# Patient Record
Sex: Male | Born: 1985 | Race: Asian | Hispanic: No | Marital: Married | Smoking: Never smoker
Health system: Southern US, Community
[De-identification: ages and names within clinical notes are randomized; demographics above are authoritative.]

## PROBLEM LIST (undated history)

## (undated) DIAGNOSIS — L4050 Arthropathic psoriasis, unspecified: Secondary | ICD-10-CM

---

## 2018-03-01 ENCOUNTER — Observation Stay (HOSPITAL_COMMUNITY): Payer: No Typology Code available for payment source | Admitting: Certified Registered"

## 2018-03-01 ENCOUNTER — Observation Stay (HOSPITAL_COMMUNITY)
Admission: EM | Admit: 2018-03-01 | Discharge: 2018-03-02 | Disposition: A | Discharge status: Home / Self Care | Payer: No Typology Code available for payment source | Attending: General Surgery | Admitting: General Surgery

## 2018-03-01 ENCOUNTER — Encounter (HOSPITAL_COMMUNITY)
Admission: EM | Disposition: A | Discharge status: Home / Self Care | Payer: Self-pay | Source: Home / Self Care | Attending: Emergency Medicine

## 2018-03-01 ENCOUNTER — Encounter (HOSPITAL_COMMUNITY): Payer: Self-pay

## 2018-03-01 ENCOUNTER — Emergency Department (HOSPITAL_COMMUNITY): Payer: No Typology Code available for payment source

## 2018-03-01 ENCOUNTER — Other Ambulatory Visit: Payer: Self-pay

## 2018-03-01 DIAGNOSIS — K3580 Unspecified acute appendicitis: Secondary | ICD-10-CM | POA: Diagnosis present

## 2018-03-01 DIAGNOSIS — K3533 Acute appendicitis with perforation and localized peritonitis, with abscess: Secondary | ICD-10-CM | POA: Diagnosis not present

## 2018-03-01 DIAGNOSIS — R109 Unspecified abdominal pain: Secondary | ICD-10-CM | POA: Diagnosis present

## 2018-03-01 HISTORY — PX: LAPAROSCOPIC APPENDECTOMY: SHX408

## 2018-03-01 LAB — COMPREHENSIVE METABOLIC PANEL
ALT: 97 U/L — ABNORMAL HIGH (ref 17–63)
ANION GAP: 12 (ref 5–15)
AST: 38 U/L (ref 15–41)
Albumin: 4.5 g/dL (ref 3.5–5.0)
Alkaline Phosphatase: 71 U/L (ref 38–126)
BUN: 13 mg/dL (ref 6–20)
CHLORIDE: 98 mmol/L — AB (ref 101–111)
CO2: 27 mmol/L (ref 22–32)
CREATININE: 0.88 mg/dL (ref 0.61–1.24)
Calcium: 10.2 mg/dL (ref 8.9–10.3)
Glucose, Bld: 124 mg/dL — ABNORMAL HIGH (ref 65–99)
POTASSIUM: 4.1 mmol/L (ref 3.5–5.1)
Sodium: 137 mmol/L (ref 135–145)
Total Bilirubin: 1.1 mg/dL (ref 0.3–1.2)
Total Protein: 8.7 g/dL — ABNORMAL HIGH (ref 6.5–8.1)

## 2018-03-01 LAB — CBC
HEMATOCRIT: 50 % (ref 39.0–52.0)
HEMOGLOBIN: 16.7 g/dL (ref 13.0–17.0)
MCH: 28.9 pg (ref 26.0–34.0)
MCHC: 33.4 g/dL (ref 30.0–36.0)
MCV: 86.7 fL (ref 78.0–100.0)
PLATELETS: 279 10*3/uL (ref 150–400)
RBC: 5.77 MIL/uL (ref 4.22–5.81)
RDW: 13.9 % (ref 11.5–15.5)
WBC: 21.2 10*3/uL — AB (ref 4.0–10.5)

## 2018-03-01 LAB — LIPASE, BLOOD: LIPASE: 24 U/L (ref 11–51)

## 2018-03-01 LAB — URINALYSIS, ROUTINE W REFLEX MICROSCOPIC
Bilirubin Urine: NEGATIVE
GLUCOSE, UA: NEGATIVE mg/dL
Hgb urine dipstick: NEGATIVE
Ketones, ur: 20 mg/dL — AB
Leukocytes, UA: NEGATIVE
Nitrite: NEGATIVE
PROTEIN: NEGATIVE mg/dL
pH: 7 (ref 5.0–8.0)

## 2018-03-01 SURGERY — APPENDECTOMY, LAPAROSCOPIC
Anesthesia: General | Site: Abdomen

## 2018-03-01 MED ORDER — BUPIVACAINE-EPINEPHRINE (PF) 0.25% -1:200000 IJ SOLN
INTRAMUSCULAR | Status: AC
  Filled 2018-03-01: qty 30

## 2018-03-01 MED ORDER — SODIUM CHLORIDE 0.9 % IV SOLN: INTRAVENOUS | Status: DC

## 2018-03-01 MED ORDER — SODIUM CHLORIDE 0.9 % IR SOLN
Status: DC | PRN
  Administered 2018-03-01: 1000 mL

## 2018-03-01 MED ORDER — SODIUM CHLORIDE 0.9 % IV SOLN
INTRAVENOUS | Status: DC
  Administered 2018-03-01: 21:00:00 via INTRAVENOUS

## 2018-03-01 MED ORDER — MORPHINE SULFATE (PF) 4 MG/ML IV SOLN
4.0000 mg | Freq: Once | INTRAVENOUS | Status: AC
  Administered 2018-03-01: 4 mg via INTRAVENOUS
  Filled 2018-03-01: qty 1

## 2018-03-01 MED ORDER — DIPHENHYDRAMINE HCL 50 MG/ML IJ SOLN: 25.0000 mg | Freq: Four times a day (QID) | INTRAMUSCULAR | Status: DC | PRN

## 2018-03-01 MED ORDER — ROCURONIUM BROMIDE 10 MG/ML (PF) SYRINGE
PREFILLED_SYRINGE | INTRAVENOUS | Status: AC
  Filled 2018-03-01: qty 5

## 2018-03-01 MED ORDER — DEXAMETHASONE SODIUM PHOSPHATE 10 MG/ML IJ SOLN
INTRAMUSCULAR | Status: DC | PRN
  Administered 2018-03-01: 10 mg via INTRAVENOUS

## 2018-03-01 MED ORDER — IOPAMIDOL (ISOVUE-300) INJECTION 61%
INTRAVENOUS | Status: AC
  Administered 2018-03-01: 100 mL
  Filled 2018-03-01: qty 100

## 2018-03-01 MED ORDER — PIPERACILLIN-TAZOBACTAM 3.375 G IVPB: 3.3750 g | Freq: Three times a day (TID) | INTRAVENOUS | Status: DC

## 2018-03-01 MED ORDER — SUGAMMADEX SODIUM 200 MG/2ML IV SOLN
INTRAVENOUS | Status: DC | PRN
  Administered 2018-03-01: 200 mg via INTRAVENOUS

## 2018-03-01 MED ORDER — ONDANSETRON 4 MG PO TBDP: 4.0000 mg | ORAL_TABLET | Freq: Four times a day (QID) | ORAL | Status: DC | PRN

## 2018-03-01 MED ORDER — MIDAZOLAM HCL 2 MG/2ML IJ SOLN
INTRAMUSCULAR | Status: AC
  Filled 2018-03-01: qty 2

## 2018-03-01 MED ORDER — GABAPENTIN 300 MG PO CAPS
300.0000 mg | ORAL_CAPSULE | Freq: Two times a day (BID) | ORAL | Status: DC
  Administered 2018-03-01 – 2018-03-02 (×2): 300 mg via ORAL
  Filled 2018-03-01 (×2): qty 1

## 2018-03-01 MED ORDER — ONDANSETRON HCL 4 MG/2ML IJ SOLN
INTRAMUSCULAR | Status: DC | PRN
  Administered 2018-03-01: 4 mg via INTRAVENOUS

## 2018-03-01 MED ORDER — FENTANYL CITRATE (PF) 250 MCG/5ML IJ SOLN
INTRAMUSCULAR | Status: AC
  Filled 2018-03-01: qty 5

## 2018-03-01 MED ORDER — METRONIDAZOLE IN NACL 5-0.79 MG/ML-% IV SOLN
500.0000 mg | Freq: Once | INTRAVENOUS | Status: AC
  Administered 2018-03-01: 500 mg via INTRAVENOUS
  Filled 2018-03-01: qty 100

## 2018-03-01 MED ORDER — BUPIVACAINE-EPINEPHRINE 0.25% -1:200000 IJ SOLN
INTRAMUSCULAR | Status: DC | PRN
  Administered 2018-03-01: 30 mL

## 2018-03-01 MED ORDER — SUCCINYLCHOLINE CHLORIDE 200 MG/10ML IV SOSY
PREFILLED_SYRINGE | INTRAVENOUS | Status: AC
  Filled 2018-03-01: qty 10

## 2018-03-01 MED ORDER — HYDROMORPHONE HCL 1 MG/ML IJ SOLN
0.5000 mg | Freq: Once | INTRAMUSCULAR | Status: AC
  Administered 2018-03-01: 0.5 mg via INTRAVENOUS
  Filled 2018-03-01: qty 1

## 2018-03-01 MED ORDER — MIDAZOLAM HCL 5 MG/5ML IJ SOLN
INTRAMUSCULAR | Status: DC | PRN
  Administered 2018-03-01: 2 mg via INTRAVENOUS

## 2018-03-01 MED ORDER — LACTATED RINGERS IV SOLN
INTRAVENOUS | Status: DC
  Administered 2018-03-01 (×2): via INTRAVENOUS

## 2018-03-01 MED ORDER — PROPOFOL 10 MG/ML IV BOLUS
INTRAVENOUS | Status: DC | PRN
  Administered 2018-03-01: 200 mg via INTRAVENOUS

## 2018-03-01 MED ORDER — KETOROLAC TROMETHAMINE 30 MG/ML IJ SOLN
30.0000 mg | Freq: Four times a day (QID) | INTRAMUSCULAR | Status: DC
  Administered 2018-03-01 – 2018-03-02 (×2): 30 mg via INTRAVENOUS
  Filled 2018-03-01 (×2): qty 1

## 2018-03-01 MED ORDER — DIPHENHYDRAMINE HCL 25 MG PO CAPS: 25.0000 mg | ORAL_CAPSULE | Freq: Four times a day (QID) | ORAL | Status: DC | PRN

## 2018-03-01 MED ORDER — PROPOFOL 10 MG/ML IV BOLUS
INTRAVENOUS | Status: AC
  Filled 2018-03-01: qty 20

## 2018-03-01 MED ORDER — HYDRALAZINE HCL 20 MG/ML IJ SOLN: 10.0000 mg | INTRAMUSCULAR | Status: DC | PRN

## 2018-03-01 MED ORDER — SCOPOLAMINE 1 MG/3DAYS TD PT72
1.0000 | MEDICATED_PATCH | TRANSDERMAL | Status: DC
  Administered 2018-03-01: 1 via TRANSDERMAL
  Filled 2018-03-01: qty 1

## 2018-03-01 MED ORDER — ROCURONIUM BROMIDE 100 MG/10ML IV SOLN
INTRAVENOUS | Status: DC | PRN
  Administered 2018-03-01: 50 mg via INTRAVENOUS

## 2018-03-01 MED ORDER — ONDANSETRON HCL 4 MG/2ML IJ SOLN: 4.0000 mg | Freq: Four times a day (QID) | INTRAMUSCULAR | Status: DC | PRN

## 2018-03-01 MED ORDER — HYDROCODONE-ACETAMINOPHEN 5-325 MG PO TABS: 1.0000 | ORAL_TABLET | ORAL | Status: DC | PRN

## 2018-03-01 MED ORDER — PROMETHAZINE HCL 25 MG/ML IJ SOLN: 6.2500 mg | INTRAMUSCULAR | Status: DC | PRN

## 2018-03-01 MED ORDER — 0.9 % SODIUM CHLORIDE (POUR BTL) OPTIME
TOPICAL | Status: DC | PRN
  Administered 2018-03-01: 1000 mL

## 2018-03-01 MED ORDER — TRAMADOL HCL 50 MG PO TABS: 50.0000 mg | ORAL_TABLET | Freq: Four times a day (QID) | ORAL | Status: DC | PRN

## 2018-03-01 MED ORDER — LIDOCAINE HCL (CARDIAC) 20 MG/ML IV SOLN
INTRAVENOUS | Status: AC
  Filled 2018-03-01: qty 5

## 2018-03-01 MED ORDER — MORPHINE SULFATE (PF) 4 MG/ML IV SOLN: 2.0000 mg | INTRAVENOUS | Status: DC | PRN

## 2018-03-01 MED ORDER — FENTANYL CITRATE (PF) 100 MCG/2ML IJ SOLN
INTRAMUSCULAR | Status: DC | PRN
  Administered 2018-03-01: 50 ug via INTRAVENOUS
  Administered 2018-03-01 (×2): 100 ug via INTRAVENOUS

## 2018-03-01 MED ORDER — SUCCINYLCHOLINE CHLORIDE 20 MG/ML IJ SOLN
INTRAMUSCULAR | Status: DC | PRN
  Administered 2018-03-01: 140 mg via INTRAVENOUS

## 2018-03-01 MED ORDER — FENTANYL CITRATE (PF) 100 MCG/2ML IJ SOLN
25.0000 ug | INTRAMUSCULAR | Status: DC | PRN
  Administered 2018-03-01: 50 ug via INTRAVENOUS

## 2018-03-01 MED ORDER — CEFTRIAXONE SODIUM 2 G IJ SOLR
2.0000 g | Freq: Once | INTRAMUSCULAR | Status: AC
  Administered 2018-03-01: 2 g via INTRAVENOUS
  Filled 2018-03-01: qty 20

## 2018-03-01 MED ORDER — FENTANYL CITRATE (PF) 100 MCG/2ML IJ SOLN
INTRAMUSCULAR | Status: AC
  Filled 2018-03-01: qty 2

## 2018-03-01 MED ORDER — LIDOCAINE HCL (CARDIAC) 20 MG/ML IV SOLN
INTRAVENOUS | Status: DC | PRN
  Administered 2018-03-01: 100 mg via INTRAVENOUS

## 2018-03-01 SURGICAL SUPPLY — 37 items
APPLIER CLIP 5 13 M/L LIGAMAX5 (MISCELLANEOUS)
CANISTER SUCT 3000ML PPV (MISCELLANEOUS) ×3 IMPLANT
CHLORAPREP W/TINT 26ML (MISCELLANEOUS) ×3 IMPLANT
CLIP APPLIE 5 13 M/L LIGAMAX5 (MISCELLANEOUS) IMPLANT
CLIP VESOLOCK XL 6/CT (CLIP) ×3 IMPLANT
COVER SURGICAL LIGHT HANDLE (MISCELLANEOUS) ×3 IMPLANT
DERMABOND ADVANCED (GAUZE/BANDAGES/DRESSINGS) ×2
DERMABOND ADVANCED .7 DNX12 (GAUZE/BANDAGES/DRESSINGS) ×1 IMPLANT
DEVICE PMI PUNCTURE CLOSURE (MISCELLANEOUS) ×3 IMPLANT
ELECT REM PT RETURN 9FT ADLT (ELECTROSURGICAL) ×3
ELECTRODE REM PT RTRN 9FT ADLT (ELECTROSURGICAL) ×1 IMPLANT
ENDOLOOP SUT PDS II  0 18 (SUTURE)
ENDOLOOP SUT PDS II 0 18 (SUTURE) IMPLANT
GLOVE BIOGEL PI IND STRL 7.0 (GLOVE) ×1 IMPLANT
GLOVE BIOGEL PI INDICATOR 7.0 (GLOVE) ×2
GLOVE SURG SS PI 7.0 STRL IVOR (GLOVE) ×3 IMPLANT
GOWN STRL REUS W/ TWL LRG LVL3 (GOWN DISPOSABLE) ×3 IMPLANT
GOWN STRL REUS W/TWL LRG LVL3 (GOWN DISPOSABLE) ×6
KIT BASIN OR (CUSTOM PROCEDURE TRAY) ×3 IMPLANT
KIT TURNOVER KIT B (KITS) ×3 IMPLANT
NEEDLE 22X1 1/2 (OR ONLY) (NEEDLE) ×3 IMPLANT
NS IRRIG 1000ML POUR BTL (IV SOLUTION) ×3 IMPLANT
PAD ARMBOARD 7.5X6 YLW CONV (MISCELLANEOUS) ×6 IMPLANT
POUCH RETRIEVAL ECOSAC 10 (ENDOMECHANICALS) ×1 IMPLANT
POUCH RETRIEVAL ECOSAC 10MM (ENDOMECHANICALS) ×2
SCISSORS LAP 5X35 DISP (ENDOMECHANICALS) ×3 IMPLANT
SET IRRIG TUBING LAPAROSCOPIC (IRRIGATION / IRRIGATOR) ×3 IMPLANT
SPECIMEN JAR SMALL (MISCELLANEOUS) ×3 IMPLANT
SUT MNCRL AB 4-0 PS2 18 (SUTURE) ×3 IMPLANT
TOWEL OR 17X24 6PK STRL BLUE (TOWEL DISPOSABLE) ×3 IMPLANT
TOWEL OR 17X26 10 PK STRL BLUE (TOWEL DISPOSABLE) ×3 IMPLANT
TRAY FOLEY CATH SILVER 16FR (SET/KITS/TRAYS/PACK) IMPLANT
TRAY LAPAROSCOPIC MC (CUSTOM PROCEDURE TRAY) ×3 IMPLANT
TROCAR XCEL NON-BLD 11X100MML (ENDOMECHANICALS) ×3 IMPLANT
TROCAR XCEL NON-BLD 5MMX100MML (ENDOMECHANICALS) ×6 IMPLANT
TUBING INSUFFLATION (TUBING) ×3 IMPLANT
WATER STERILE IRR 1000ML POUR (IV SOLUTION) ×3 IMPLANT

## 2018-03-01 NOTE — Progress Notes (Signed)
RT instructed pt and family on the use of incentive spirometer.  Pt able to reach 1000 mL with good technique.

## 2018-03-01 NOTE — Op Note (Signed)
Preoperative diagnosis: acute suppurative appendicitis  Postoperative diagnosis: Same   Procedure: laparoscopic appendectomy  Surgeon: Christopher Stokes, M.D.  Asst: none  Anesthesia: Gen.   Indications for procedure: Christopher Stokes is a 32 y.o. male with symptoms of pain in right lower quadrant and nausea consistent with acute appendicitis. Confirmed by CT scan and laboratory values.  Description of procedure: The patient was brought into the operative suite, placed supine. Anesthesia was administered with endotracheal tube. The patient's left arm was tucked. All pressure points were offloaded by foam padding. The patient was prepped and draped in the usual sterile fashion.  A transverse incision was made to the left of the umbilicus and a 5mm trocar was us. Pneumoperitoneum was applied with high flow low pressure.  2 5mm trocars were placed, one in the suprapubic space, one in the LLQ, the periumbilical incision was then up-sized and a 11mm trocar placed in that space. All trocars sites were first anesthesized with Marcaine. Next the patient was placed in trendelenberg, rotated to the left. The omentum was retracted cephalad. The cecum and appendix were identified. The appendix was very inflamed and multiple filmy adhesions were attached to the abdominal wall, terminal ileum and appendix. These adhesions were carefully taken down with sharp dissection. The base of the appendix was dissected and a window through the mesoappendix was created with blunt dissection. Large Hem-o-lock clips were used to doubly ligate the base of the appendix and mesoappendix. The appendix was cut free with scissors.  The appendix was placed in a specimen bag. The pelvis and RLQ were irrigated. The appendix was removed via the umbilicus. 0 vicryl was used to close the fascial defect. Pneumoperitoneum was removed, all trocars were removed. All incisions were closed with 4-0 monocryl subcuticular stitch. The patient woke  from anesthesia and was brought to PACU in stable condition.  Findings: acute suppurative appendicitis  Specimen: appendix  Blood loss: 30 ml  Local anesthesia: 30 ml marcaine  Complications: none  Christopher RossettiLuke Delitha Stokes, M.D. General, Bariatric, & Minimally Invasive Surgery [facility]

## 2018-03-01 NOTE — Anesthesia Postprocedure Evaluation (Signed)
Anesthesia Post Note  Patient: Christopher Stokes  Procedure(s) Performed: APPENDECTOMY LAPAROSCOPIC (N/A Abdomen)     Patient location during evaluation: PACU Anesthesia Type: General Level of consciousness: awake and alert Pain management: pain level controlled Vital Signs Assessment: post-procedure vital signs reviewed and stable Respiratory status: spontaneous breathing, nonlabored ventilation and respiratory function stable Cardiovascular status: blood pressure returned to baseline and stable Postop Assessment: no apparent nausea or vomiting Anesthetic complications: no    Last Vitals:  Vitals:   03/01/18 1945 03/01/18 2006  BP:  (!) 110/54  Pulse: (!) 109 (!) 111  Resp: 17 16  Temp:  37.7 C  SpO2: 96% 95%    Last Pain:  Vitals:   03/01/18 1945  TempSrc:   PainSc: 0-No pain                 Beryle Lathehomas E Maykel Reitter

## 2018-03-01 NOTE — Anesthesia Preprocedure Evaluation (Addendum)
Anesthesia Evaluation  Patient identified by MRN, date of birth, ID band Patient awake    Reviewed: Allergy & Precautions, NPO status , Patient's Chart, lab work & pertinent test results  History of Anesthesia Complications Negative for: history of anesthetic complications  Airway Mallampati: II  TM Distance: >3 FB Neck ROM: Full    Dental no notable dental hx. (+) Dental Advisory Given   Pulmonary neg pulmonary ROS,    Pulmonary exam normal        Cardiovascular negative cardio ROS Normal cardiovascular exam     Neuro/Psych negative neurological ROS  negative psych ROS   GI/Hepatic Neg liver ROS,   Endo/Other  negative endocrine ROS  Renal/GU negative Renal ROS     Musculoskeletal negative musculoskeletal ROS (+)   Abdominal   Peds  Hematology negative hematology ROS (+)   Anesthesia Other Findings Day of surgery medications reviewed with the patient.  Reproductive/Obstetrics                            Anesthesia Physical Anesthesia Plan  ASA: II and emergent  Anesthesia Plan: General   Post-op Pain Management:    Induction: Intravenous, Rapid sequence and Cricoid pressure planned  PONV Risk Score and Plan: 3 and Ondansetron, Dexamethasone, Scopolamine patch - Pre-op and Diphenhydramine  Airway Management Planned: Oral ETT  Additional Equipment:   Intra-op Plan:   Post-operative Plan: Extubation in OR  Informed Consent: I have reviewed the patients History and Physical, chart, labs and discussed the procedure including the risks, benefits and alternatives for the proposed anesthesia with the patient or authorized representative who has indicated his/her understanding and acceptance.   Dental advisory given  Plan Discussed with: CRNA and Anesthesiologist  Anesthesia Plan Comments:        Anesthesia Quick Evaluation

## 2018-03-01 NOTE — Transfer of Care (Signed)
Immediate Anesthesia Transfer of Care Note  Patient: Christopher Stokes  Procedure(s) Performed: APPENDECTOMY LAPAROSCOPIC (N/A Abdomen)  Patient Location: PACU  Anesthesia Type:General  Level of Consciousness: oriented, sedated, drowsy, patient cooperative and responds to stimulation  Airway & Oxygen Therapy: Patient Spontanous Breathing and Patient connected to nasal cannula oxygen  Post-op Assessment: Report given to RN, Post -op Vital signs reviewed and stable and Patient moving all extremities X 4  Post vital signs: Reviewed and stable  Last Vitals:  Vitals Value Taken Time  BP 158/75 03/01/2018  7:07 PM  Temp    Pulse 126 03/01/2018  7:08 PM  Resp 28 03/01/2018  7:08 PM  SpO2 100 % 03/01/2018  7:08 PM  Vitals shown include unvalidated device data.  Last Pain:  Vitals:   03/01/18 1624  TempSrc:   PainSc: 0-No pain         Complications: No apparent anesthesia complications

## 2018-03-01 NOTE — Anesthesia Procedure Notes (Signed)
Procedure Name: Intubation Date/Time: 03/01/2018 6:12 PM Performed by: Rosiland OzMeyers, Teion Ballin, CRNA Pre-anesthesia Checklist: Patient identified, Emergency Drugs available, Suction available, Patient being monitored and Timeout performed Patient Re-evaluated:Patient Re-evaluated prior to induction Oxygen Delivery Method: Circle system utilized Preoxygenation: Pre-oxygenation with 100% oxygen Induction Type: IV induction, Rapid sequence and Cricoid Pressure applied Laryngoscope Size: Miller and 3 Grade View: Grade I Tube type: Oral Tube size: 7.5 mm Number of attempts: 1 Airway Equipment and Method: Stylet Placement Confirmation: ETT inserted through vocal cords under direct vision,  positive ETCO2 and breath sounds checked- equal and bilateral Secured at: 21 cm Tube secured with: Tape Dental Injury: Teeth and Oropharynx as per pre-operative assessment

## 2018-03-01 NOTE — ED Triage Notes (Signed)
Pt reports abdominal pain at level of umbilicus and RLQ extremely tender to palpation. Pt endorses vomiting x 3. Denies diarrhea.

## 2018-03-01 NOTE — ED Provider Notes (Signed)
[facility] Provider Note   CSN: [id number] Arrival date & time: 03/01/18  1130     History   Chief Complaint Chief Complaint  Patient presents with  . Abdominal Pain    HPI Christopher Stokes is a 32 y.o. male.  HPI   32 year old male presents today with complaints of abdominal pain.  Patient reports that around 11:00 last night she developed severe onset periumbilical and right lower quadrant abdominal pain.  Patient reports nausea and vomiting, he attempted to eat around 2 PM today but vomited it back up.  Patient denies any history of the same.  He denies any abdominal surgeries, no chronic health conditions not taking any daily medications.  History reviewed. No pertinent past medical history.  Patient Active Problem List   Diagnosis Date Noted  . Acute appendicitis 03/01/2018    History reviewed. No pertinent surgical history.      Home Medications    Prior to Admission medications   Medication Sig Start Date End Date Taking? Authorizing Provider  acetaminophen (TYLENOL) 500 MG tablet Take 1,000 mg by mouth every 6 (six) hours as needed for mild pain.   Yes [provider]  bismuth subsalicylate (PEPTO BISMOL) 262 MG chewable tablet Chew 524 mg by mouth as needed for indigestion.   Yes [provider]  Liniments (SALONPAS PAIN RELIEF PATCH EX) Apply 1 patch topically as needed (pain).   Yes [provider]  loratadine (CLARITIN) 10 MG tablet Take 10 mg by mouth daily as needed for allergies.   Yes [provider]    Family History No family history on file.  Social History Social History   Tobacco Use  . Smoking status: Never Smoker  . Smokeless tobacco: Never Used  Substance Use Topics  . Alcohol use: Yes  . Drug use: Not on file     Allergies   Patient has no known allergies.   Review of Systems Review of Systems  All other systems reviewed and are negative.    Physical  Exam Updated Vital Signs BP 125/85   Pulse (!) 102   Temp 98.8 F (37.1 C) (Oral)   Resp 20   SpO2 98%   Physical Exam  Constitutional: He is oriented to person, place, and time. He appears well-developed and well-nourished.  HENT:  Head: Normocephalic and atraumatic.  Eyes: Pupils are equal, round, and reactive to light. Conjunctivae are normal. Right eye exhibits no discharge. Left eye exhibits no discharge. No scleral icterus.  Neck: Normal range of motion. No JVD present. No tracheal deviation present.  Pulmonary/Chest: Effort normal. No stridor.  Abdominal:  Exquisite tenderness to palpation of the right lower quadrant-remainder of abdominal quadrants without acute tenderness, rebound or guarding  Neurological: He is alert and oriented to person, place, and time. Coordination normal.  Psychiatric: He has a normal mood and affect. His behavior is normal. Judgment and thought content normal.  Nursing note and vitals reviewed.   ED Treatments / Results  Labs (all labs ordered are listed, but only abnormal results are displayed) Labs Reviewed  COMPREHENSIVE METABOLIC PANEL - Abnormal; Notable for the following components:      Result Value   Chloride 98 (*)    Glucose, Bld 124 (*)    Total Protein 8.7 (*)    ALT 97 (*)    All other components within normal limits  CBC - Abnormal; Notable for the following components:   WBC 21.2 (*)  All other components within normal limits  LIPASE, BLOOD  URINALYSIS, ROUTINE W REFLEX MICROSCOPIC  HIV ANTIBODY (ROUTINE TESTING)    EKG None  Radiology Ct Abdomen Pelvis W Contrast  Result Date: 03/01/2018 CLINICAL DATA:  Periumbilical and right lower quadrant pain with associated vomiting. EXAM: CT ABDOMEN AND PELVIS WITH CONTRAST TECHNIQUE: Multidetector CT imaging of the abdomen and pelvis was performed using the standard protocol following bolus administration of intravenous contrast. CONTRAST:  ISOVUE-300 IOPAMIDOL  (ISOVUE-300) INJECTION 61% COMPARISON:  None. FINDINGS: Lower chest: Clear lung bases.  Heart normal size. Hepatobiliary: Normal liver. Subtle density within the gallbladder may reflect a small polyp or stone. Gallbladder otherwise unremarkable. No bile duct dilation. Pancreas: Unremarkable. No pancreatic ductal dilatation or surrounding inflammatory changes. Spleen: Normal in size without focal abnormality. Adrenals/Urinary Tract: Adrenal glands are unremarkable. Kidneys are normal, without renal calculi, focal lesion, or hydronephrosis. Bladder is unremarkable. Stomach/Bowel: The appendix curls along the inferior margin of the cecal tip. Its wall is poorly defined and there are adjacent inflammatory changes. Appendix measures approximately 1 cm in greatest diameter. There is no defined peri appendiceal fluid collection to suggest an abscess. No definite rupture. Stomach, small bowel and colon are unremarkable. Vascular/Lymphatic: No significant vascular findings are present. No enlarged abdominal or pelvic lymph nodes. Reproductive: Unremarkable Other: No abdominal wall hernia or abnormality. No abdominopelvic ascites. Musculoskeletal: No acute or significant osseous findings. IMPRESSION: 1. Acute appendicitis. No peri appendiceal abscess. The appendiceal wall is poorly defined. Rupture is possible but not definitively seen. 2. No other acute abnormalities. 3. Possible small gallstone versus gallbladder polyp. No other abnormalities. Electronically Signed   By: Amie Portland M.D.   On: 03/01/2018 14:08    Procedures Procedures (including critical care time)  Medications Ordered in ED Medications  0.9 %  sodium chloride infusion (has no administration in time range)  diphenhydrAMINE (BENADRYL) capsule 25 mg (has no administration in time range)    Or  diphenhydrAMINE (BENADRYL) injection 25 mg (has no administration in time range)  ondansetron (ZOFRAN-ODT) disintegrating tablet 4 mg (has no  administration in time range)    Or  ondansetron (ZOFRAN) injection 4 mg (has no administration in time range)  morphine 4 MG/ML injection 2-3 mg (has no administration in time range)  iopamidol (ISOVUE-300) 61 % injection (100 mLs  Contrast Given 03/01/18 1342)  morphine 4 MG/ML injection 4 mg (4 mg Intravenous Given 03/01/18 1404)  cefTRIAXone (ROCEPHIN) 2 g in sodium chloride 0.9 % 100 mL IVPB (0 g Intravenous Stopped 03/01/18 1501)    And  metroNIDAZOLE (FLAGYL) IVPB 500 mg (0 mg Intravenous Stopped 03/01/18 1605)  HYDROmorphone (DILAUDID) injection 0.5 mg (0.5 mg Intravenous Given 03/01/18 1429)     Initial Impression / Assessment and Plan / ED Course  I have reviewed the triage vital signs and the nursing notes.  Pertinent labs & imaging results that were available during my care of the patient were reviewed by me and considered in my medical decision making (see chart for details).     Final Clinical Impressions(s) / ED Diagnoses   Final diagnoses:  Acute appendicitis, unspecified acute appendicitis type   Labs: Lipase, CMP, CBC  Imaging: CT abdomen pelvis with contrast  Consults: General surgery  Therapeutics: Ceftriaxone, metronidazole  Discharge Meds:   Assessment/Plan: 32 year old male presents today with acute appendicitis.  Patient with a white count of 21.2.  Patient CT scan shows acute appendicitis no abscess, unable to determine if this is been ruptured or  not.  Patient afebrile, started on Rocephin, Flagyl.  Patient given dose of pain medicine.  General surgery will be consulted for ongoing evaluation and management.   ED Discharge Orders    None       Rosalio Loud 03/01/18 1630    Mancel Bale, MD 03/03/18 2035

## 2018-03-01 NOTE — H&P (Addendum)
Christopher Stokes is an 32 y.o. male.   Chief Complaint: abdominal pain, nausea and vomiting  HPI: Pt developed acute pain around his umbilicus last PM about 11 PM  It got worse with nausea and vomiting. Pain is now in the RLQ. He tried to eat at 2AM and vomited it back up.    Work up in the ED shows she is afebrile, VSS.  WBC 21.2, H/H 16.7/50 Platelets are normal at 279K CMP OK creatinine is 0.88 ALT up to 07, total protein down some.  CT scan with contrast:  Acute appendicitis. No peri appendiceal abscess. The appendiceal wall is poorly defined. Rupture is possible but not definitively seen.  No other acute abnormalities.  Possible small gallstone versus gallbladder polyp. No other abnormalities.   History reviewed. No pertinent past medical history.  History reviewed. No pertinent surgical history.  No family history on file. Social History:  reports that he has never smoked. He has never used smokeless tobacco. He reports that he drinks alcohol. His drug history is not on file.   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 No surgeries   Allergies: No Known Allergies   Prior to Admission medications   Not on File   No prior surgeries  Results for orders placed or performed during the hospital encounter of 03/01/18 (from the past 48 hour(s))  Lipase, blood     Status: None   Collection Time: 03/01/18 12:31 PM  Result Value Ref Range   Lipase 24 11 - 51 U/L    Comment: Performed at [facility], [place] [address], [place], [place] [address]  Comprehensive metabolic panel     Status: Abnormal   Collection Time: 03/01/18 12:31 PM  Result Value Ref Range   Sodium 137 135 - 145 mmol/L   Potassium 4.1 3.5 - 5.1 mmol/L   Chloride 98 (L) 101 - 111 mmol/L   CO2 27 22 - 32 mmol/L   Glucose, Bld 124 (H) 65 - 99 mg/dL   BUN 13 6 - 20 mg/dL   Creatinine, Ser 0.88 0.61 - 1.24 mg/dL   Calcium 10.2 8.9 - 10.3 mg/dL   Total Protein 8.7 (H) 6.5 -  8.1 g/dL   Albumin 4.5 3.5 - 5.0 g/dL   AST 38 15 - 41 U/L   ALT 97 (H) 17 - 63 U/L   Alkaline Phosphatase 71 38 - 126 U/L   Total Bilirubin 1.1 0.3 - 1.2 mg/dL   GFR calc non Af Amer >60 >60 mL/min   GFR calc Af Amer >60 >60 mL/min    Comment: (NOTE) The eGFR has been calculated using the CKD EPI equation. This calculation has not been validated in all clinical situations. eGFR's persistently <60 mL/min signify possible Chronic Kidney Disease.    Anion gap 12 5 - 15    Comment: Performed at [facility] [address], [place], [place] [address]  CBC     Status: Abnormal   Collection Time: 03/01/18 12:31 PM  Result Value Ref Range   WBC 21.2 (H) 4.0 - 10.5 K/uL   RBC 5.77 4.22 - 5.81 MIL/uL   Hemoglobin 16.7 13.0 - 17.0 g/dL   HCT 50.0 39.0 - 52.0 %   MCV 86.7 78.0 - 100.0 fL   MCH 28.9 26.0 - 34.0 pg   MCHC 33.4 30.0 - 36.0 g/dL   RDW 13.9 11.5 - 15.5 %   Platelets 279  150 - 400 K/uL    Comment: Performed at [facility], [place] [address], [place], [place] [address]   Ct Abdomen Pelvis W Contrast  Result Date: 03/01/2018 CLINICAL DATA:  Periumbilical and right lower quadrant pain with associated vomiting. EXAM: CT ABDOMEN AND PELVIS WITH CONTRAST TECHNIQUE: Multidetector CT imaging of the abdomen and pelvis was performed using the standard protocol following bolus administration of intravenous contrast. CONTRAST:  174m ISOVUE-300 IOPAMIDOL (ISOVUE-300) INJECTION 61% COMPARISON:  None. FINDINGS: Lower chest: Clear lung bases.  Heart normal size. Hepatobiliary: Normal liver. Subtle density within the gallbladder may reflect a small polyp or stone. Gallbladder otherwise unremarkable. No bile duct dilation. Pancreas: Unremarkable. No pancreatic ductal dilatation or surrounding inflammatory changes. Spleen: Normal in size without focal abnormality. Adrenals/Urinary Tract: Adrenal glands are unremarkable. Kidneys are normal, without renal calculi, focal lesion, or  hydronephrosis. Bladder is unremarkable. Stomach/Bowel: The appendix curls along the inferior margin of the cecal tip. Its wall is poorly defined and there are adjacent inflammatory changes. Appendix measures approximately 1 cm in greatest diameter. There is no defined peri appendiceal fluid collection to suggest an abscess. No definite rupture. Stomach, small bowel and colon are unremarkable. Vascular/Lymphatic: No significant vascular findings are present. No enlarged abdominal or pelvic lymph nodes. Reproductive: Unremarkable Other: No abdominal wall hernia or abnormality. No abdominopelvic ascites. Musculoskeletal: No acute or significant osseous findings. IMPRESSION: 1. Acute appendicitis. No peri appendiceal abscess. The appendiceal wall is poorly defined. Rupture is possible but not definitively seen. 2. No other acute abnormalities. 3. Possible small gallstone versus gallbladder polyp. No other abnormalities. Electronically Signed   By: DLajean ManesM.D.   On: 03/01/2018 14:08    Review of Systems  Constitutional: Positive for chills. Negative for diaphoresis, fever, malaise/fatigue and weight loss.  HENT: Negative.   Eyes: Negative.   Respiratory: Negative.   Cardiovascular: Negative.   Gastrointestinal: Positive for abdominal pain, constipation (some hx of constipation) and vomiting. Negative for diarrhea and heartburn.  Genitourinary: Negative.   Musculoskeletal: Negative.   Skin: Negative.   Neurological: Negative.   Endo/Heme/Allergies: Negative.   Psychiatric/Behavioral: Negative.     Blood pressure (!) 137/96, pulse 97, temperature 98.8 F (37.1 C), temperature source Oral, resp. rate 20, SpO2 100 %. Physical Exam  Constitutional: He is oriented to person, place, and time. He appears well-developed and well-nourished. No distress.  He feels febrile, but oral temp remains normal right now.  HENT:  Head: Normocephalic and atraumatic.  Mouth/Throat: Oropharynx is clear and  moist. No oropharyngeal exudate.  Eyes: Right eye exhibits no discharge. Left eye exhibits no discharge. No scleral icterus.  Pupils are equal  Neck: Normal range of motion. Neck supple. No JVD present. No tracheal deviation present. No thyromegaly present.  Cardiovascular: Normal rate, regular rhythm, normal heart sounds and intact distal pulses.  No murmur heard. Respiratory: Effort normal and breath sounds normal. No respiratory distress. He has no wheezes. He has no rales. He exhibits no tenderness.  GI: Soft. Bowel sounds are normal. He exhibits no distension and no mass. There is tenderness (RLQ main point of pain now). There is rebound (a little RUQ). There is no guarding.  Musculoskeletal: Normal range of motion. He exhibits no edema or tenderness.  Lymphadenopathy:    He has no cervical adenopathy.  Neurological: He is alert and oriented to person, place, and time. No cranial nerve deficit.  Skin: Skin is warm and dry. No rash noted. He is not diaphoretic. No erythema. No  pallor.  Psychiatric: He has a normal mood and affect. His behavior is normal. Judgment and thought content normal.     Assessment/Plan Acute appendicitis with possible perforation  Plan:  Admit, IV fluids, IV antibiotics, and surgery later this PM.  He received both Rocephin and Flagyl in the ER, so will leave him on this for now.    Mattson Dayal, PA-C 03/01/2018, 3:25 PM

## 2018-03-01 NOTE — ED Notes (Signed)
Pt refused a urinal. Pt ambulated gingerly to the bathroom with assistance from family member.

## 2018-03-02 ENCOUNTER — Encounter (HOSPITAL_COMMUNITY): Payer: Self-pay | Admitting: General Surgery

## 2018-03-02 MED ORDER — HYDROCODONE-ACETAMINOPHEN 5-325 MG PO TABS: 1.0000 | ORAL_TABLET | Freq: Four times a day (QID) | ORAL | 0 refills | Status: DC | PRN

## 2018-03-02 NOTE — Discharge Instructions (Signed)

## 2018-03-02 NOTE — Discharge Summary (Signed)
[facility] Discharge Summary   Patient ID: Christopher Stokes MRN: [id number] DOB/AGE: [birth year] 32 y.o.  Admit date: 03/01/2018 Discharge date: 03/02/2018  Admitting Diagnosis: Acute appendicitis  Discharge Diagnosis Patient Active Problem List   Diagnosis Date Noted  . Acute appendicitis 03/01/2018    Consultants None  Imaging: Ct Abdomen Pelvis W Contrast  Result Date: 03/01/2018 CLINICAL DATA:  Periumbilical and right lower quadrant pain with associated vomiting. EXAM: CT ABDOMEN AND PELVIS WITH CONTRAST TECHNIQUE: Multidetector CT imaging of the abdomen and pelvis was performed using the standard protocol following bolus administration of intravenous contrast. CONTRAST:  ISOVUE-300 IOPAMIDOL (ISOVUE-300) INJECTION 61% COMPARISON:  None. FINDINGS: Lower chest: Clear lung bases.  Heart normal size. Hepatobiliary: Normal liver. Subtle density within the gallbladder may reflect a small polyp or stone. Gallbladder otherwise unremarkable. No bile duct dilation. Pancreas: Unremarkable. No pancreatic ductal dilatation or surrounding inflammatory changes. Spleen: Normal in size without focal abnormality. Adrenals/Urinary Tract: Adrenal glands are unremarkable. Kidneys are normal, without renal calculi, focal lesion, or hydronephrosis. Bladder is unremarkable. Stomach/Bowel: The appendix curls along the inferior margin of the cecal tip. Its wall is poorly defined and there are adjacent inflammatory changes. Appendix measures approximately 1 cm in greatest diameter. There is no defined peri appendiceal fluid collection to suggest an abscess. No definite rupture. Stomach, small bowel and colon are unremarkable. Vascular/Lymphatic: No significant vascular findings are present. No enlarged abdominal or pelvic lymph nodes. Reproductive: Unremarkable Other: No abdominal wall hernia or abnormality. No abdominopelvic ascites. Musculoskeletal: No acute or significant osseous findings.  IMPRESSION: 1. Acute appendicitis. No peri appendiceal abscess. The appendiceal wall is poorly defined. Rupture is possible but not definitively seen. 2. No other acute abnormalities. 3. Possible small gallstone versus gallbladder polyp. No other abnormalities. Electronically Signed   By: Amie Portland M.D.   On: 03/01/2018 14:08    Procedures Dr. Sheliah Hatch (03/01/18) - Laparoscopic Appendectomy  Hospital Course:  Christopher Stokes is a 32yo male who presented to [facility] 4/1 with acute onset abdominal pain, nausea, and vomiting.  Workup showed acute appendicitis.  Patient was admitted and underwent procedure listed above.  Tolerated procedure well and was transferred to the floor.  Diet was advanced as tolerated. On POD1, the patient was voiding well, tolerating diet, ambulating well, pain well controlled, vital signs stable, incisions c/d/i and felt stable for discharge home.  Patient will follow up in our office in 2 weeks and knows to call with questions or concerns.   I have personally reviewed the patients medication history on the [place] controlled substance database.    Physical Exam: General:  Alert, NAD, pleasant, comfortable Pulm: effort normal Cardio: RRR Abd:  Soft, ND, nontender, +BS, multiple lap incisions C/D/I  Allergies as of 03/02/2018   No Known Allergies     Medication List    TAKE these medications   acetaminophen 500 MG tablet Commonly known as:  TYLENOL Take 1,000 mg by mouth every 6 (six) hours as needed for mild pain.   bismuth subsalicylate 262 MG chewable tablet Commonly known as:  PEPTO BISMOL Chew 524 mg by mouth as needed for indigestion.   HYDROcodone-acetaminophen 5-325 MG tablet Commonly known as:  NORCO/VICODIN Take 1 tablet by mouth every 6 (six) hours as needed for severe pain.   loratadine 10 MG tablet Commonly known as:  CLARITIN Take 10 mg by mouth daily as needed for allergies.   SALONPAS PAIN RELIEF PATCH EX Apply 1 patch topically as needed  (pain).  Follow-up Information    [facility] [place] Call.   Specialty:  General Surgery Why:  We are working on your appointment, please call to confirm. Please arrive 30 minutes prior to your appointment to check in and fill out paperwork. Bring photo ID and insurance information. Contact information: [address] [place] [id number] [phone]          Signed: Franne FortsBrooke A Lennix Rotundo, [facility] 03/02/2018, 8:23 AM Pager: [phone] Consults: [phone] Mon-Fri 7:00 am-4:30 pm Sat-Sun 7:00 am-11:30 am

## 2018-03-02 NOTE — Progress Notes (Signed)
Patient discharged to home instrucitons.

## 2019-10-03 IMAGING — CT CT ABD-PELV W/ CM
2 of 4 series · 16 of 46 positions shown, 18 images · IV contrast (APPLIED)
Comparison: None.

CLINICAL DATA: Periumbilical and right lower quadrant pain with
associated vomiting.

EXAM:
CT ABDOMEN AND PELVIS WITH CONTRAST
TECHNIQUE: Multidetector CT imaging of the abdomen and pelvis was performed
using the standard protocol following bolus administration of
intravenous contrast.
CONTRAST:  100mL BWQNTC-OFF IOPAMIDOL (BWQNTC-OFF) INJECTION 61%

[Series 3: abd/ pelvis 5.0 i30f 2 · axial · 0.73mm/px · z∈[+672,+1127]mm · 13 of 101 slices shown, 15 images]
[im 5/101  soft-tissue]
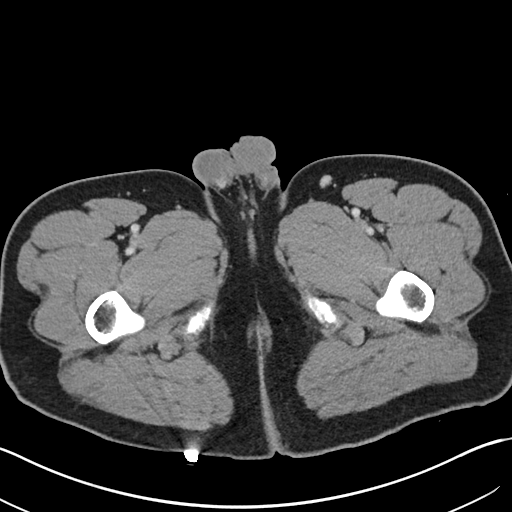
[im 5/101  bone]
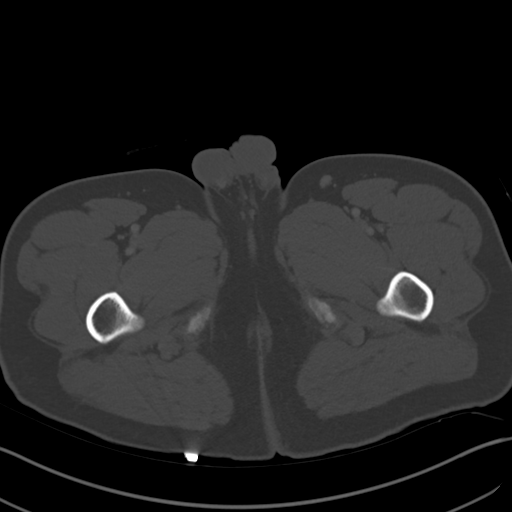
[im 13/101  soft-tissue]
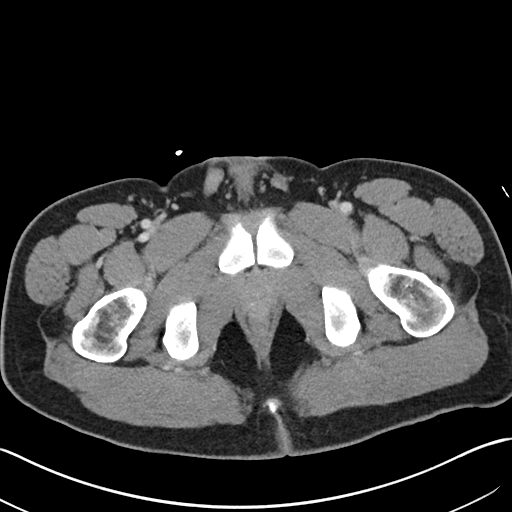
[im 21/101  soft-tissue]
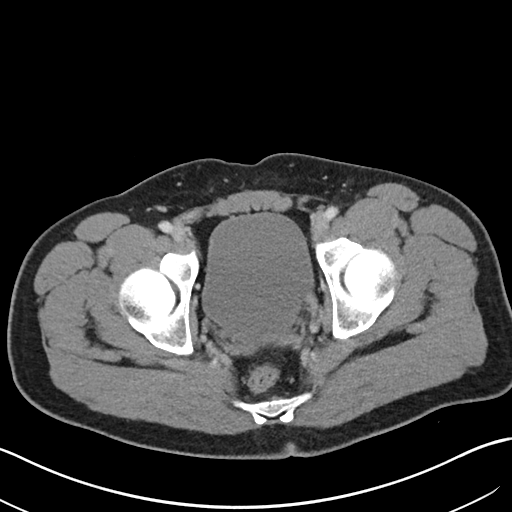
[im 30/101  soft-tissue]
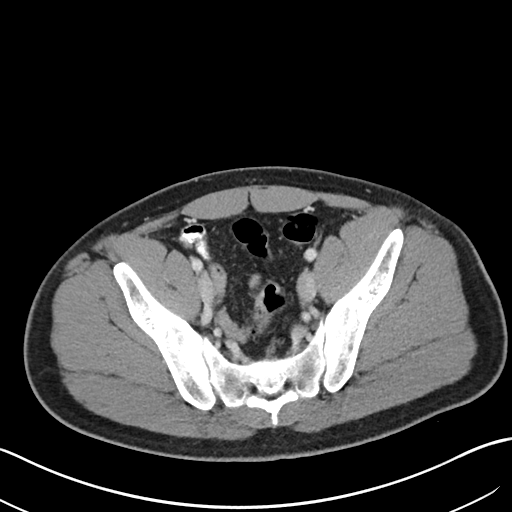
[im 34/101  soft-tissue]
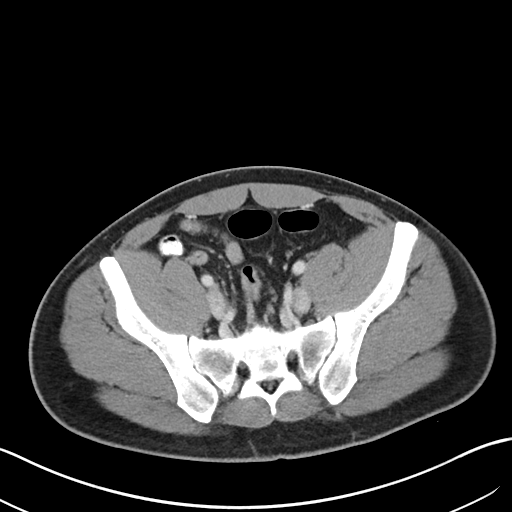
[im 42/101  soft-tissue]
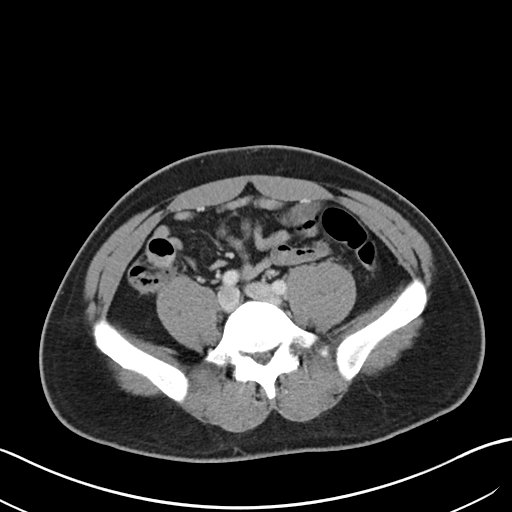
[im 51/101  soft-tissue]
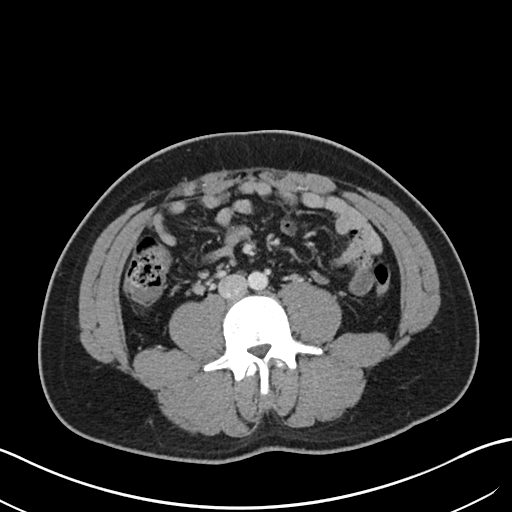
[im 59/101  soft-tissue]
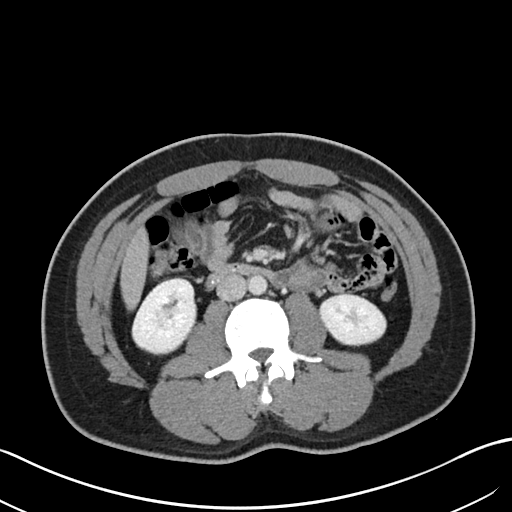
[im 67/101  soft-tissue]
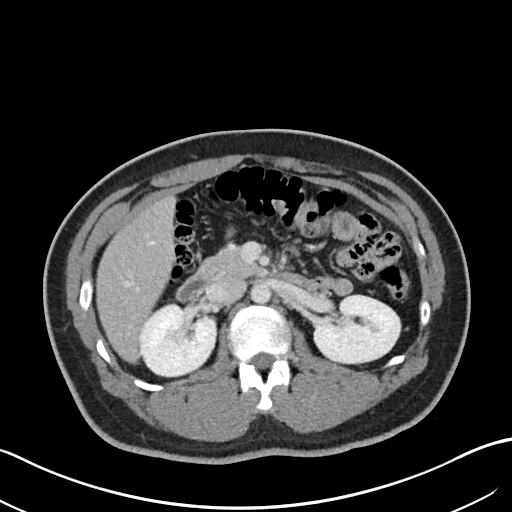
[im 67/101  bone]
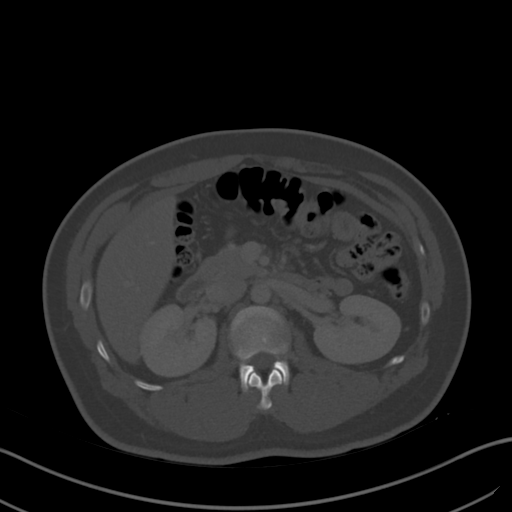
[im 71/101  soft-tissue]
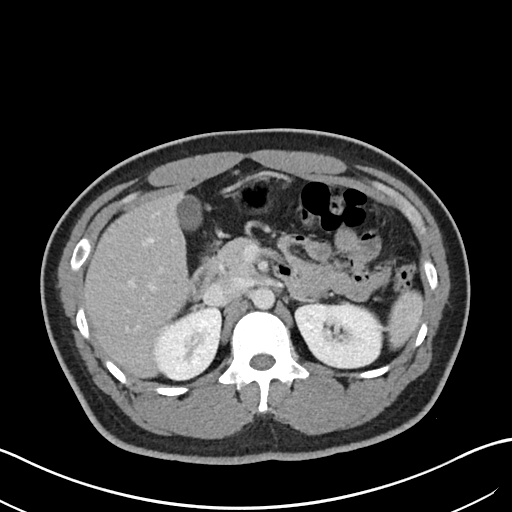
[im 80/101  soft-tissue]
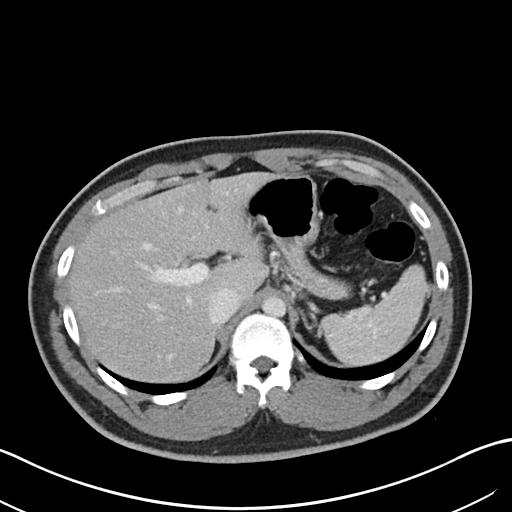
[im 88/101  soft-tissue]
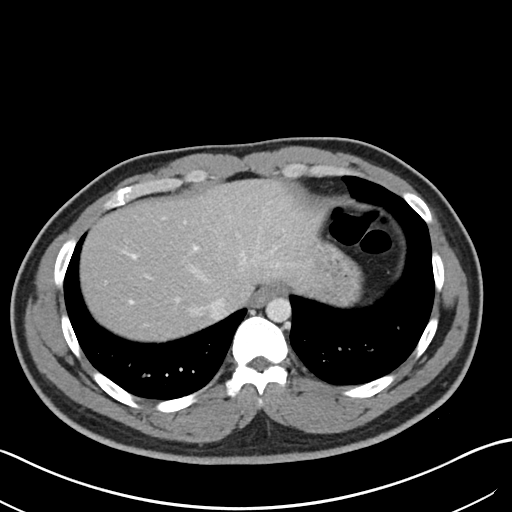
[im 96/101  soft-tissue]
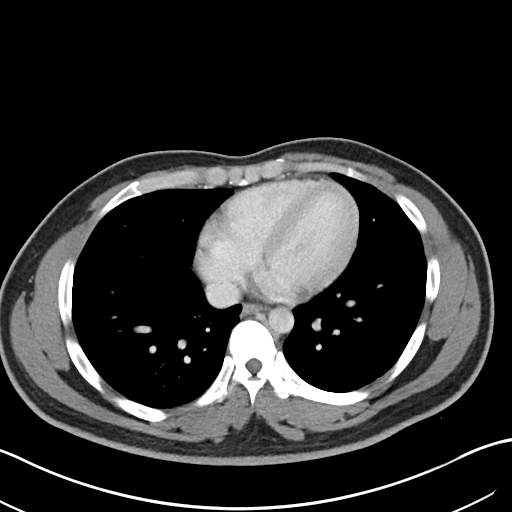

[Series 6: coronal soft tissue · coronal · 0.81mm/px · 3 of 101 slices shown]
[im 34/101  soft-tissue]
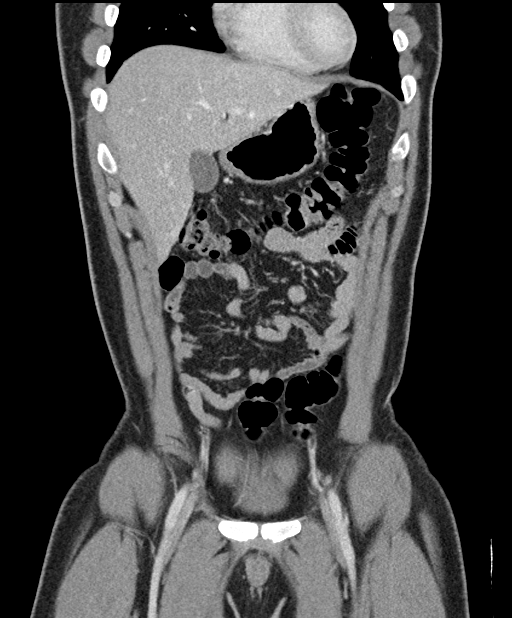
[im 45/101  soft-tissue]
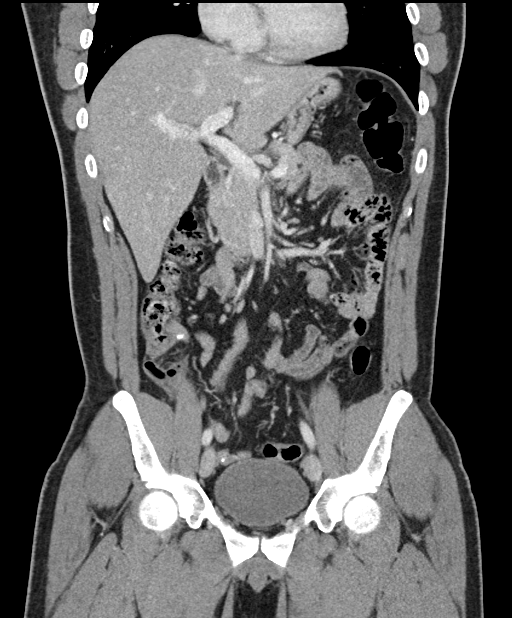
[im 56/101  soft-tissue]
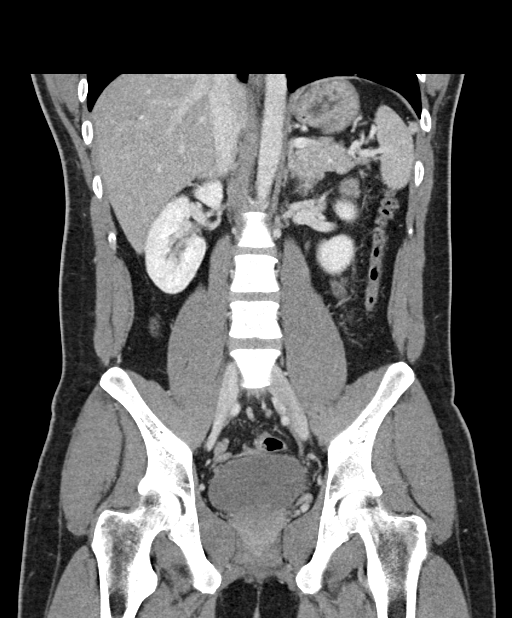

[16 of 46 positions shown; findings below may reference images not displayed]

FINDINGS: Lower chest: Clear lung bases.  Heart normal size.

Hepatobiliary: Normal liver. Subtle density within the gallbladder
may reflect a small polyp or stone. Gallbladder otherwise
unremarkable. No bile duct dilation.

Pancreas: Unremarkable. No pancreatic ductal dilatation or
surrounding inflammatory changes.

Spleen: Normal in size without focal abnormality.

Adrenals/Urinary Tract: Adrenal glands are unremarkable. Kidneys are
normal, without renal calculi, focal lesion, or hydronephrosis.
Bladder is unremarkable.

Stomach/Bowel: The appendix curls along the inferior margin of the
cecal tip. Its wall is poorly defined and there are adjacent
inflammatory changes. Appendix measures approximately 1 cm in
greatest diameter. There is no defined peri appendiceal fluid
collection to suggest an abscess. No definite rupture.

Stomach, small bowel and colon are unremarkable.

Vascular/Lymphatic: No significant vascular findings are present. No
enlarged abdominal or pelvic lymph nodes.

Reproductive: Unremarkable

Other: No abdominal wall hernia or abnormality. No abdominopelvic
ascites.

Musculoskeletal: No acute or significant osseous findings.
IMPRESSION: 1. Acute appendicitis. No peri appendiceal abscess. The appendiceal
wall is poorly defined. Rupture is possible but not definitively
seen.
2. No other acute abnormalities.
3. Possible small gallstone versus gallbladder polyp. No other
abnormalities.

## 2019-11-02 ENCOUNTER — Other Ambulatory Visit: Payer: Self-pay

## 2019-11-02 DIAGNOSIS — Z20822 Contact with and (suspected) exposure to covid-19: Secondary | ICD-10-CM

## 2019-11-04 LAB — NOVEL CORONAVIRUS, NAA: SARS-CoV-2, NAA: NOT DETECTED

## 2020-12-12 ENCOUNTER — Encounter: Payer: Self-pay | Admitting: Podiatry

## 2020-12-12 ENCOUNTER — Ambulatory Visit (INDEPENDENT_AMBULATORY_CARE_PROVIDER_SITE_OTHER): Payer: Self-pay

## 2020-12-12 ENCOUNTER — Other Ambulatory Visit: Payer: Self-pay | Admitting: Podiatry

## 2020-12-12 ENCOUNTER — Other Ambulatory Visit: Payer: Self-pay

## 2020-12-12 ENCOUNTER — Ambulatory Visit (INDEPENDENT_AMBULATORY_CARE_PROVIDER_SITE_OTHER): Payer: Self-pay | Admitting: Podiatry

## 2020-12-12 DIAGNOSIS — M79672 Pain in left foot: Secondary | ICD-10-CM

## 2020-12-12 DIAGNOSIS — M79671 Pain in right foot: Secondary | ICD-10-CM

## 2020-12-12 DIAGNOSIS — M722 Plantar fascial fibromatosis: Secondary | ICD-10-CM

## 2020-12-12 NOTE — Progress Notes (Signed)
  Subjective:  Patient ID: Christopher Stokes, male    DOB: 1986-08-26,  MRN: [id number]  Chief Complaint  Patient presents with  . Foot Pain    Left foot heel pain for about a month. PT stated that he is on his feet a lot and gets a lot of foot pain    35 y.o. male presents with the above complaint.  Patient presents with complaint left heel pain that has been going on for about months.  Patient states is on his feet a lot and gets a lot of foot pain.  He constantly stands for at least few hours a day.  He states that he wears regular shoes.  He has not seen anyone else prior to seeing me.  He also states that is sharp shooting stabbing pain.  Pain scale 7 out of 10.  He would like to discuss treatment options he has not seen anyone else prior to seeing me for this.   Review of Systems: Negative except as noted in the HPI. Denies N/V/F/Ch.  History reviewed. No pertinent past medical history.  Current Outpatient Medications:  .  acetaminophen (TYLENOL) 500 MG tablet, Take 1,000 mg by mouth every 6 (six) hours as needed for mild pain., Disp: , Rfl:  .  bismuth subsalicylate (PEPTO BISMOL) 262 MG chewable tablet, Chew 524 mg by mouth as needed for indigestion., Disp: , Rfl:  .  HYDROcodone-acetaminophen (NORCO/VICODIN) 5-325 MG tablet, Take 1 tablet by mouth every 6 (six) hours as needed for severe pain., Disp: 20 tablet, Rfl: 0 .  Liniments (SALONPAS PAIN RELIEF PATCH EX), Apply 1 patch topically as needed (pain)., Disp: , Rfl:  .  loratadine (CLARITIN) 10 MG tablet, Take 10 mg by mouth daily as needed for allergies., Disp: , Rfl:   Social History   Tobacco Use  Smoking Status Never Smoker  Smokeless Tobacco Never Used    No Known Allergies Objective:  There were no vitals filed for this visit. There is no height or weight on file to calculate BMI. Constitutional Well developed. Well nourished.  Vascular Dorsalis pedis pulses palpable bilaterally. Posterior tibial pulses palpable  bilaterally. Capillary refill normal to all digits.  No cyanosis or clubbing noted. Pedal hair growth normal.  Neurologic Normal speech. Oriented to person, place, and time. Epicritic sensation to light touch grossly present bilaterally.  Dermatologic Nails well groomed and normal in appearance. No open wounds. No skin lesions.  Orthopedic: Normal joint ROM without pain or crepitus bilaterally. No visible deformities. Tender to palpation at the calcaneal tuber left. No pain with calcaneal squeeze left. Ankle ROM diminished range of motion left. Silfverskiold Test: positive left.   Radiographs: Taken and reviewed. No acute fractures or dislocations. No evidence of stress fracture.  Plantar heel spur absent. Posterior heel spur present.   Assessment:   1. Pain in both feet   2. Plantar fasciitis of left foot    Plan:  Patient was evaluated and treated and all questions answered.  Plantar Fasciitis, left - XR reviewed as above.  - Educated on icing and stretching. Instructions given.  - Injection delivered to the plantar fascia as below. - DME: Plantar Fascial Brace - Pharmacologic management: None  Procedure: Injection Tendon/Ligament Location: Left plantar fascia at the glabrous junction; medial approach. Skin Prep: alcohol Injectate: 0.5 cc 0.5% marcaine plain, 0.5 cc of 1% Lidocaine, 0.5 cc kenalog 10. Disposition: Patient tolerated procedure well. Injection site dressed with a band-aid.  No follow-ups on file.

## 2021-01-09 ENCOUNTER — Ambulatory Visit (INDEPENDENT_AMBULATORY_CARE_PROVIDER_SITE_OTHER): Payer: Self-pay | Admitting: Podiatry

## 2021-01-09 ENCOUNTER — Encounter: Payer: Self-pay | Admitting: Podiatry

## 2021-01-09 ENCOUNTER — Other Ambulatory Visit: Payer: Self-pay

## 2021-01-09 DIAGNOSIS — M722 Plantar fascial fibromatosis: Secondary | ICD-10-CM

## 2021-01-09 DIAGNOSIS — M7732 Calcaneal spur, left foot: Secondary | ICD-10-CM

## 2021-01-10 ENCOUNTER — Encounter: Payer: Self-pay | Admitting: Podiatry

## 2021-01-10 NOTE — Progress Notes (Signed)
  Subjective:  Patient ID: Christopher Stokes, male    DOB: 1986/02/06,  MRN: [id number]  Chief Complaint  Patient presents with  . Foot Pain    Pt stated that his pain has gotten a little better but he is still having some issues with it.    35 y.o. male presents with the above complaint.  Patient presents with follow-up of left plantar fasciitis.  Patient states he is about 80% better.  He states the injection helped considerably.  He has been wearing the brace as well.  He would like to know what his next treatment plans are.  He denies any other acute complaints.  Review of Systems: Negative except as noted in the HPI. Denies N/V/F/Ch.  History reviewed. No pertinent past medical history.  Current Outpatient Medications:  .  acetaminophen (TYLENOL) 500 MG tablet, Take 1,000 mg by mouth every 6 (six) hours as needed for mild pain., Disp: , Rfl:  .  bismuth subsalicylate (PEPTO BISMOL) 262 MG chewable tablet, Chew 524 mg by mouth as needed for indigestion., Disp: , Rfl:  .  HYDROcodone-acetaminophen (NORCO/VICODIN) 5-325 MG tablet, Take 1 tablet by mouth every 6 (six) hours as needed for severe pain., Disp: 20 tablet, Rfl: 0 .  Liniments (SALONPAS PAIN RELIEF PATCH EX), Apply 1 patch topically as needed (pain)., Disp: , Rfl:  .  loratadine (CLARITIN) 10 MG tablet, Take 10 mg by mouth daily as needed for allergies., Disp: , Rfl:   Social History   Tobacco Use  Smoking Status Never Smoker  Smokeless Tobacco Never Used    No Known Allergies Objective:  There were no vitals filed for this visit. There is no height or weight on file to calculate BMI. Constitutional Well developed. Well nourished.  Vascular Dorsalis pedis pulses palpable bilaterally. Posterior tibial pulses palpable bilaterally. Capillary refill normal to all digits.  No cyanosis or clubbing noted. Pedal hair growth normal.  Neurologic Normal speech. Oriented to person, place, and time. Epicritic sensation to light  touch grossly present bilaterally.  Dermatologic Nails well groomed and normal in appearance. No open wounds. No skin lesions.  Orthopedic: Normal joint ROM without pain or crepitus bilaterally. No visible deformities. Tender to palpation at the calcaneal tuber left. No pain with calcaneal squeeze left. Ankle ROM diminished range of motion left. Silfverskiold Test: positive left.   Radiographs: Taken and reviewed. No acute fractures or dislocations. No evidence of stress fracture.  Plantar heel spur absent. Posterior heel spur present.   Assessment:   1. Plantar fasciitis of left foot   2. Heel spur, left    Plan:  Patient was evaluated and treated and all questions answered.  Plantar Fasciitis, left with underlying heel spur - XR reviewed as above.  - Educated on icing and stretching. Instructions given.  -Second injection delivered to the plantar fascia as below. - DME: Plantar Fascial Brace - Pharmacologic management: None  Procedure: Injection Tendon/Ligament Location: Left plantar fascia at the glabrous junction; medial approach. Skin Prep: alcohol Injectate: 0.5 cc 0.5% marcaine plain, 0.5 cc of 1% Lidocaine, 0.5 cc kenalog 10. Disposition: Patient tolerated procedure well. Injection site dressed with a band-aid.  No follow-ups on file.

## 2021-09-17 ENCOUNTER — Other Ambulatory Visit: Payer: Self-pay

## 2021-09-17 ENCOUNTER — Ambulatory Visit
Admission: RE | Admit: 2021-09-17 | Discharge: 2021-09-17 | Disposition: A | Discharge status: Home / Self Care | Payer: No Typology Code available for payment source | Source: Ambulatory Visit

## 2021-09-17 VITALS — BP 136/86 | HR 85 | Temp 97.7°F | Resp 18 | Wt 194.0 lb

## 2021-09-17 DIAGNOSIS — M25549 Pain in joints of unspecified hand: Secondary | ICD-10-CM

## 2021-09-17 DIAGNOSIS — M2550 Pain in unspecified joint: Secondary | ICD-10-CM

## 2021-09-17 MED ORDER — PREDNISONE 10 MG PO TABS: 10.0000 mg | ORAL_TABLET | Freq: Every day | ORAL | 1 refills | Status: AC

## 2021-09-17 MED ORDER — METHYLPREDNISOLONE ACETATE 80 MG/ML IJ SUSP
80.0000 mg | Freq: Once | INTRAMUSCULAR | Status: AC
  Administered 2021-09-17: 80 mg via INTRAMUSCULAR

## 2021-09-17 NOTE — Discharge Instructions (Signed)
The sudden onset of pain, swelling and stiffness in the joints of your fingers feet and ankles is concerning for autoimmune disease.  We have checked labs on you today which should help [place] determine a little better with the underlying cause may be.  Those results will be made available to you once they are received.  In the meantime, you received an injection of a high-dose steroid which should get the inflammation under control fairly quickly.  I have also prescribed you a daily dose of prednisone which should be taken regularly, not as needed.  I reached out to our referral department to assist you with finding a primary care physician.  Primary care physician will likely perform further testing based on the results of your test today.  They may also refer you to a rheumatologist for specialty care.  If you find that the dose of prednisone that I prescribed for you was insufficient, please let us know and I can increase it.  Prednisone is not meant to be taken long-term at doses higher than 20 mg and to always be taken at the lowest possible dose.  I have prescribed a 10 mg dose today.

## 2021-09-17 NOTE — ED Triage Notes (Signed)
Pt states having bilateral hand/ finger swelling for a few weeks. Patient states when he attempts to close hands they hurt, he also states he has ankle and toe pain.

## 2021-09-17 NOTE — ED Provider Notes (Signed)
[facility]    CSN: [id number] Arrival date & time: 09/17/21  0848      History   Chief Complaint Chief Complaint  Patient presents with   Hand Pain    HPI Christopher Stokes is a 35 y.o. male.   Pt states having bilateral hand/ finger swelling for the past month. Patient states when he attempts to close hands they hurt, he also states he has ankle and toe pain.  Patient states he is never had this before.  Patient states that it came on suddenly with no prodrome.  Patient denies recent illness, recent travel outside the country, sick contacts, significant past medical history, states he is otherwise been well throughout his life.  The history is provided by the patient.   History reviewed. No pertinent past medical history.  Patient Active Problem List   Diagnosis Date Noted   Acute appendicitis 03/01/2018    Past Surgical History:  Procedure Laterality Date   LAPAROSCOPIC APPENDECTOMY N/A 03/01/2018   Procedure: APPENDECTOMY LAPAROSCOPIC;  Surgeon: Kinsinger, De Blanch, MD;  Location: [facility] OR;  Service: General;  Laterality: N/A;       Home Medications    Prior to Admission medications   Medication Sig Start Date End Date Taking? Authorizing Provider  naproxen sodium (ALEVE) 220 MG tablet Take 220 mg by mouth.   Yes [provider]  predniSONE (DELTASONE) 10 MG tablet Take 1 tablet (10 mg total) by mouth daily with breakfast. 09/17/21 10/17/21 Yes Theadora Rama Scales, PA-C  acetaminophen (TYLENOL) 500 MG tablet Take 1,000 mg by mouth every 6 (six) hours as needed for mild pain.    [provider]    Family History History reviewed. No pertinent family history.  Social History Social History   Tobacco Use   Smoking status: Never   Smokeless tobacco: Never  Substance Use Topics   Alcohol use: Yes     Allergies   Patient has no known allergies.   Review of Systems Review of Systems Pertinent findings noted in history of  present illness.    Physical Exam Triage Vital Signs ED Triage Vitals  Enc Vitals Group     BP      Pulse      Resp      Temp      Temp src      SpO2      Weight      Height      Head Circumference      Peak Flow      Pain Score      Pain Loc      Pain Edu?      Excl. in GC?    No data found.  Updated Vital Signs BP 136/86 (BP Location: Right Arm)   Pulse 85   Temp 97.7 F (36.5 C) (Oral)   Resp 18   Wt 194 lb (88 kg)   SpO2 97%   Visual Acuity Right Eye Distance:   Left Eye Distance:   Bilateral Distance:    Right Eye Near:   Left Eye Near:    Bilateral Near:     Physical Exam Vitals and nursing note reviewed.  Constitutional:      Appearance: Normal appearance.  HENT:     Head: Normocephalic and atraumatic.  Eyes:     Conjunctiva/sclera: Conjunctivae normal.  Cardiovascular:     Rate and Rhythm: Normal rate and regular rhythm.     Heart sounds: Normal heart sounds.  Pulmonary:     Effort: Pulmonary effort is normal.     Breath sounds: Normal breath sounds.  Abdominal:     General: Abdomen is flat. Bowel sounds are normal.     Palpations: Abdomen is soft.  Musculoskeletal:        General: Normal range of motion.     Comments: Diffuse swelling, point tenderness and stiffness of all proximal and distal joints of all fingers, metatarsals and ankles.  Patient is weightbearing but endorses pain.  Skin:    General: Skin is warm and dry.  Neurological:     General: No focal deficit present.     Mental Status: He is alert and oriented to person, place, and time.  Psychiatric:        Mood and Affect: Mood normal.        Behavior: Behavior normal.     UC Treatments / Results  Labs (all labs ordered are listed, but only abnormal results are displayed) Labs Reviewed  CBC WITH DIFFERENTIAL/PLATELET  SEDIMENTATION RATE  C-REACTIVE PROTEIN  ANTINUCLEAR ANTIBODIES, IFA  URIC ACID  RHEUMATOID FACTOR  CYCLIC CITRUL PEPTIDE ANTIBODY, IGG/IGA   COMPREHENSIVE METABOLIC PANEL    EKG   Radiology No results found.  Procedures Procedures (including critical care time)  Medications Ordered in UC Medications  methylPREDNISolone acetate (DEPO-MEDROL) injection 80 mg (80 mg Intramuscular Given 09/17/21 0954)    Initial Impression / Assessment and Plan / UC Course  I have reviewed the triage vital signs and the nursing notes.  Pertinent labs & imaging results that were available during my care of the patient were reviewed by me and considered in my medical decision making (see chart for details).     Patient's acute onset of swelling and pain is concerning for autoimmune disease.  I performed an autoimmune screening panel as well as a CBC, uric acid level and metabolic panel.  I have strongly encouraged patient to find a primary care provider, I have initiated a request to help him with this.  I provided patient with a high-dose steroid injection in the office today and started him on a maintenance dose of prednisone 10 mg.  I have advised him that if this dose is insufficient and keeping his pain and swelling under control, he should reach out to [place] for dose increase until he can get established with a primary care provider.  Patient verbalized understanding and agreement of plan as discussed.  All questions were addressed during visit.  Please see discharge instructions below for further details of plan.  Final Clinical Impressions(s) / UC Diagnoses   Final diagnoses:  Pain in multiple finger joints  Pain in joint involving multiple sites     Discharge Instructions      The sudden onset of pain, swelling and stiffness in the joints of your fingers feet and ankles is concerning for autoimmune disease.  We have checked labs on you today which should help [place] determine a little better with the underlying cause may be.  Those results will be made available to you once they are received.  In the meantime, you received an  injection of a high-dose steroid which should get the inflammation under control fairly quickly.  I have also prescribed you a daily dose of prednisone which should be taken regularly, not as needed.  I reached out to our referral department to assist you with finding a primary care physician.  Primary care physician will likely perform further testing based on the results  of your test today.  They may also refer you to a rheumatologist for specialty care.  If you find that the dose of prednisone that I prescribed for you was insufficient, please let us know and I can increase it.  Prednisone is not meant to be taken long-term at doses higher than 20 mg and to always be taken at the lowest possible dose.  I have prescribed a 10 mg dose today.     ED Prescriptions     Medication Sig Dispense Auth. Provider   predniSONE (DELTASONE) 10 MG tablet Take 1 tablet (10 mg total) by mouth daily with breakfast. 30 tablet Theadora Rama Scales, PA-C      PDMP not reviewed this encounter.   Theadora Rama Scales, PA-C 09/17/21 [phone]

## 2021-09-18 LAB — SEDIMENTATION RATE: Sed Rate: 31 mm/hr — ABNORMAL HIGH (ref 0–15)

## 2021-09-19 LAB — CBC WITH DIFFERENTIAL/PLATELET
Basophils Absolute: 0.1 10*3/uL (ref 0.0–0.2)
Basos: 1 %
EOS (ABSOLUTE): 0.2 10*3/uL (ref 0.0–0.4)
Eos: 2 %
Hematocrit: 47.7 % (ref 37.5–51.0)
Hemoglobin: 15.7 g/dL (ref 13.0–17.7)
Immature Grans (Abs): 0.1 10*3/uL (ref 0.0–0.1)
Immature Granulocytes: 1 %
Lymphocytes Absolute: 3.1 10*3/uL (ref 0.7–3.1)
Lymphs: 29 %
MCH: 27.6 pg (ref 26.6–33.0)
MCHC: 32.9 g/dL (ref 31.5–35.7)
MCV: 84 fL (ref 79–97)
Monocytes Absolute: 0.9 10*3/uL (ref 0.1–0.9)
Monocytes: 8 %
Neutrophils Absolute: 6.5 10*3/uL (ref 1.4–7.0)
Neutrophils: 59 %
Platelets: 329 10*3/uL (ref 150–450)
RBC: 5.68 x10E6/uL (ref 4.14–5.80)
RDW: 14.8 % (ref 11.6–15.4)
WBC: 10.8 10*3/uL (ref 3.4–10.8)

## 2021-09-19 LAB — COMPREHENSIVE METABOLIC PANEL
ALT: 46 IU/L — ABNORMAL HIGH (ref 0–44)
AST: 21 IU/L (ref 0–40)
Albumin/Globulin Ratio: 1.4 (ref 1.2–2.2)
Albumin: 4.3 g/dL (ref 4.0–5.0)
Alkaline Phosphatase: 80 IU/L (ref 44–121)
BUN/Creatinine Ratio: 21 — ABNORMAL HIGH (ref 9–20)
BUN: 18 mg/dL (ref 6–20)
Bilirubin Total: 0.5 mg/dL (ref 0.0–1.2)
CO2: 19 mmol/L — ABNORMAL LOW (ref 20–29)
Calcium: 9.8 mg/dL (ref 8.7–10.2)
Chloride: 100 mmol/L (ref 96–106)
Creatinine, Ser: 0.84 mg/dL (ref 0.76–1.27)
Globulin, Total: 3 g/dL (ref 1.5–4.5)
Glucose: 117 mg/dL — ABNORMAL HIGH (ref 70–99)
Potassium: 4.4 mmol/L (ref 3.5–5.2)
Sodium: 137 mmol/L (ref 134–144)
Total Protein: 7.3 g/dL (ref 6.0–8.5)
eGFR: 119 mL/min/{1.73_m2} (ref >59)

## 2021-09-19 LAB — ANTINUCLEAR ANTIBODIES, IFA: ANA Titer 1: NEGATIVE

## 2021-09-19 LAB — RHEUMATOID FACTOR: Rheumatoid fact SerPl-aCnc: <10 IU/mL (ref <14.0)

## 2021-09-19 LAB — URIC ACID: Uric Acid: 5 mg/dL (ref 3.8–8.4)

## 2021-09-19 LAB — CYCLIC CITRUL PEPTIDE ANTIBODY, IGG/IGA: Cyclic Citrullin Peptide Ab: 5 units (ref 0–19)

## 2021-09-19 LAB — C-REACTIVE PROTEIN: CRP: 14 mg/L — ABNORMAL HIGH (ref 0–10)

## 2022-09-04 ENCOUNTER — Ambulatory Visit (INDEPENDENT_AMBULATORY_CARE_PROVIDER_SITE_OTHER): Payer: Self-pay | Admitting: Nurse Practitioner

## 2022-09-04 ENCOUNTER — Encounter: Payer: Self-pay | Admitting: Nurse Practitioner

## 2022-09-04 VITALS — BP 116/80 | HR 87 | Ht 66.0 in | Wt 194.1 lb

## 2022-09-04 DIAGNOSIS — R5383 Other fatigue: Secondary | ICD-10-CM

## 2022-09-04 DIAGNOSIS — M1990 Unspecified osteoarthritis, unspecified site: Secondary | ICD-10-CM

## 2022-09-04 DIAGNOSIS — Z7689 Persons encountering health services in other specified circumstances: Secondary | ICD-10-CM

## 2022-09-04 DIAGNOSIS — Z0000 Encounter for general adult medical examination without abnormal findings: Secondary | ICD-10-CM

## 2022-09-04 MED ORDER — CELECOXIB 200 MG PO CAPS
200.0000 mg | ORAL_CAPSULE | Freq: Two times a day (BID) | ORAL | 1 refills | Status: DC
Start: 2022-09-04 — End: 2022-11-19

## 2022-09-04 NOTE — Progress Notes (Signed)
New Patient Office Visit  Subjective    Patient ID: Christopher Stokes, male    DOB: 12-Jun-1986  Age: 36 y.o. MRN: [id number]  CC:  Chief Complaint  Patient presents with   Establish Care    HPI Christopher Stokes presents to establish care Patient has not had a primary care provider.  Overdue to have CPE and routine labs.  He is having arthritis pain in the hands and sometimes the ankles.  -the middle finger of his right hand is so swollen and tender, he is unable to make a fist. The index finger of the left hand is uncomfortably swollen.  -most recent visit to urgent care was few days ago. He was given prednisone which he states did help.  -joints of fingers are still swollen. He has several days left of current prednisone taper . --has been going on for about a year. Recently getting worse.  --he has been to three different urgent care providers and no recommendations have been mad.  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  Outpatient Encounter Medications as of 09/04/2022  Medication Sig   celecoxib (CELEBREX) 200 MG capsule Take 1 capsule (200 mg total) by mouth 2 (two) times daily.   acetaminophen (TYLENOL) 500 MG tablet Take 1,000 mg by mouth every 6 (six) hours as needed for mild pain.   [DISCONTINUED] naproxen sodium (ALEVE) 220 MG tablet Take 220 mg by mouth.   No facility-administered encounter medications on file as of 09/04/2022.    History reviewed. No pertinent past medical history.  Past Surgical History:  Procedure Laterality Date   LAPAROSCOPIC APPENDECTOMY N/A 03/01/2018   Procedure: APPENDECTOMY LAPAROSCOPIC;  Surgeon: Kinsinger, Arta Bruce, MD;  Location: [facility];  Service: General;  Laterality: N/A;    Family History  Problem Relation Age of Onset   Diabetes Father    High Cholesterol Father    High blood pressure Father    High blood pressure Brother    High Cholesterol Brother      Social History   Socioeconomic History   Marital status: Married    Spouse name: Not on file   Number of children: Not on file   Years of education: Not on file   Highest education level: Not on file  Occupational History   Not on file  Tobacco Use   Smoking status: Never   Smokeless tobacco: Never  Substance and Sexual Activity   Alcohol use: Yes   Drug use: Never   Sexual activity: Yes  Other Topics Concern   Not on file  Social History Narrative   Not on file   Social Determinants of Health   Financial Resource Strain: Not on file  Food Insecurity: Not on file  Transportation Needs: Not on file  Physical Activity: Not on file  Stress: Not on file  Social Connections: Not on file  Intimate Partner Violence: Not on file    Review of Systems  Constitutional:  Positive for malaise/fatigue. Negative for chills and fever.  HENT:  Negative for congestion, sinus pain and sore throat.   Eyes: Negative.   Respiratory:  Negative for cough, shortness of breath and wheezing.   Cardiovascular:  Negative for chest pain, palpitations and leg swelling.  Gastrointestinal:  Negative for constipation, diarrhea, nausea and vomiting.  Genitourinary: Negative.   Musculoskeletal:  Positive for joint pain and myalgias.  Skin: Negative.   Neurological:  Negative for dizziness and headaches.  Endo/Heme/Allergies:  Does not bruise/bleed easily.  Psychiatric/Behavioral:  Negative for depression. The patient is not nervous/anxious.         Objective    Today's Vitals   09/04/22 1019  BP: 116/80  Pulse: 87  SpO2: 97%  Weight: 194 lb 1.9 oz (88.1 kg)  Height: 5\' 6"  (1.676 m)   Body mass index is 31.33 kg/m.   Physical Exam Vitals and nursing note reviewed.  Constitutional:      Appearance: Normal appearance. He is well-developed.  HENT:     Head: Normocephalic and atraumatic.     Nose: Nose normal.     Mouth/Throat:     Mouth: Mucous membranes are moist.     Pharynx:  Oropharynx is clear.  Eyes:     Extraocular Movements: Extraocular movements intact.     Conjunctiva/sclera: Conjunctivae normal.     Pupils: Pupils are equal, round, and reactive to light.  Cardiovascular:     Rate and Rhythm: Normal rate and regular rhythm.     Pulses: Normal pulses.     Heart sounds: Normal heart sounds.  Pulmonary:     Effort: Pulmonary effort is normal.     Breath sounds: Normal breath sounds.  Abdominal:     Palpations: Abdomen is soft.  Musculoskeletal:        General: Normal range of motion.     Cervical back: Normal range of motion and neck supple.     Comments: Joint pain with inflammation of the joints of the fingers. Grip strength  weak, bilaterally.  Generalized joint pain with some point tenderness present. No abnormalities or deformities are noted.   Lymphadenopathy:     Cervical: No cervical adenopathy.  Skin:    General: Skin is warm and dry.     Capillary Refill: Capillary refill takes less than 2 seconds.  Neurological:     General: No focal deficit present.     Mental Status: He is alert and oriented to person, place, and time.  Psychiatric:        Mood and Affect: Mood normal.        Behavior: Behavior normal.        Thought Content: Thought content normal.        Judgment: Judgment normal.    Assessment & Plan:  1. Inflammatory arthritis Start ce;ebrex 200 mg twice daily as needed for pain and inflammation. Continue prednisone taper as previously prescribed. Check labs with connective tissue panel for further evaluation  - celecoxib (CELEBREX) 200 MG capsule; Take 1 capsule (200 mg total) by mouth 2 (two) times daily.  Dispense: 60 capsule; Refill: 1 - ANA w/Reflex if Positive - Rheumatoid Factor - C-reactive protein - Sedimentation rate  2. Other fatigue Check thyroid panel, CMP, and CBC - TSH + free T4 - Comprehensive metabolic panel - CBC  3. Encounter to establish care Appointment today to establish new primary care provider     4. Healthcare maintenance Routine, fasting labs drawn during today's visit.  - Lipid panel - Comprehensive metabolic panel - CBC    Problem List Items Addressed This Visit       Musculoskeletal and Integument   Inflammatory arthritis - Primary   Relevant Medications   celecoxib (CELEBREX) 200 MG capsule   Other Relevant Orders   ANA w/Reflex if Positive (Completed)   Rheumatoid Factor (Completed)   C-reactive protein (Completed)   Sedimentation rate (Completed)     Other   Other  fatigue   Relevant Orders   TSH + free T4 (Completed)   Comprehensive metabolic panel (Completed)   CBC (Completed)   Other Visit Diagnoses     Encounter to establish care       Healthcare maintenance       Relevant Orders   Lipid panel (Completed)   Comprehensive metabolic panel (Completed)   CBC (Completed)       Return in about 2 weeks (around 09/18/2022) for arthritis .   Carlean Jews, NP

## 2022-09-05 LAB — COMPREHENSIVE METABOLIC PANEL
ALT: 289 IU/L — ABNORMAL HIGH (ref 0–44)
AST: 112 IU/L — ABNORMAL HIGH (ref 0–40)
Albumin/Globulin Ratio: 1.2 (ref 1.2–2.2)
Albumin: 4.3 g/dL (ref 4.1–5.1)
Alkaline Phosphatase: 83 IU/L (ref 44–121)
BUN/Creatinine Ratio: 16 (ref 9–20)
BUN: 15 mg/dL (ref 6–20)
Bilirubin Total: 0.4 mg/dL (ref 0.0–1.2)
CO2: 26 mmol/L (ref 20–29)
Calcium: 9.8 mg/dL (ref 8.7–10.2)
Chloride: 99 mmol/L (ref 96–106)
Creatinine, Ser: 0.94 mg/dL (ref 0.76–1.27)
Globulin, Total: 3.6 g/dL (ref 1.5–4.5)
Glucose: 94 mg/dL (ref 70–99)
Potassium: 4.3 mmol/L (ref 3.5–5.2)
Sodium: 139 mmol/L (ref 134–144)
Total Protein: 7.9 g/dL (ref 6.0–8.5)
eGFR: 110 mL/min/{1.73_m2} (ref >59)

## 2022-09-05 LAB — ANA W/REFLEX IF POSITIVE
Anti JO-1: <0.2 AI (ref 0.0–0.9)
Anti Nuclear Antibody (ANA): POSITIVE — AB
Centromere Ab Screen: <0.2 AI (ref 0.0–0.9)
Chromatin Ab SerPl-aCnc: <0.2 AI (ref 0.0–0.9)
ENA RNP Ab: 1.2 AI — ABNORMAL HIGH (ref 0.0–0.9)
ENA SM Ab Ser-aCnc: <0.2 AI (ref 0.0–0.9)
ENA SSA (RO) Ab: <0.2 AI (ref 0.0–0.9)
ENA SSB (LA) Ab: <0.2 AI (ref 0.0–0.9)
Scleroderma (Scl-70) (ENA) Antibody, IgG: <0.2 AI (ref 0.0–0.9)
dsDNA Ab: 1 IU/mL (ref 0–9)

## 2022-09-05 LAB — TSH+FREE T4
Free T4: 1.37 ng/dL (ref 0.82–1.77)
TSH: 0.866 u[IU]/mL (ref 0.450–4.500)

## 2022-09-05 LAB — CBC
Hematocrit: 48.6 % (ref 37.5–51.0)
Hemoglobin: 16.4 g/dL (ref 13.0–17.7)
MCH: 29 pg (ref 26.6–33.0)
MCHC: 33.7 g/dL (ref 31.5–35.7)
MCV: 86 fL (ref 79–97)
Platelets: 369 10*3/uL (ref 150–450)
RBC: 5.66 x10E6/uL (ref 4.14–5.80)
RDW: 12.6 % (ref 11.6–15.4)
WBC: 13.7 10*3/uL — ABNORMAL HIGH (ref 3.4–10.8)

## 2022-09-05 LAB — LIPID PANEL
Chol/HDL Ratio: 2.4 ratio (ref 0.0–5.0)
Cholesterol, Total: 184 mg/dL (ref 100–199)
HDL: 76 mg/dL (ref >39)
LDL Chol Calc (NIH): 95 mg/dL (ref 0–99)
Triglycerides: 69 mg/dL (ref 0–149)
VLDL Cholesterol Cal: 13 mg/dL (ref 5–40)

## 2022-09-05 LAB — RHEUMATOID FACTOR: Rheumatoid fact SerPl-aCnc: <10 IU/mL (ref <14.0)

## 2022-09-05 LAB — C-REACTIVE PROTEIN: CRP: 2 mg/L (ref 0–10)

## 2022-09-05 LAB — SEDIMENTATION RATE: Sed Rate: 19 mm/hr — ABNORMAL HIGH (ref 0–15)

## 2022-09-15 NOTE — Progress Notes (Signed)
Labs abnormal. LFTs elevated. Positive ANA and sed rate. Will need referral to rheumatology and GI. Further hepatic functions should be ordered. Discuss with patient at visit 09/19/2022.

## 2022-09-16 ENCOUNTER — Ambulatory Visit: Payer: Self-pay | Admitting: Nurse Practitioner

## 2022-09-19 ENCOUNTER — Ambulatory Visit (INDEPENDENT_AMBULATORY_CARE_PROVIDER_SITE_OTHER): Payer: Self-pay | Admitting: Nurse Practitioner

## 2022-09-19 ENCOUNTER — Encounter: Payer: Self-pay | Admitting: Nurse Practitioner

## 2022-09-19 VITALS — BP 123/79 | HR 89 | Ht 66.0 in | Wt 196.1 lb

## 2022-09-19 DIAGNOSIS — M1990 Unspecified osteoarthritis, unspecified site: Secondary | ICD-10-CM

## 2022-09-19 DIAGNOSIS — R7689 Other specified abnormal immunological findings in serum: Secondary | ICD-10-CM

## 2022-09-19 DIAGNOSIS — R945 Abnormal results of liver function studies: Secondary | ICD-10-CM

## 2022-09-19 DIAGNOSIS — M1380 Other specified arthritis, unspecified site: Secondary | ICD-10-CM

## 2022-09-19 DIAGNOSIS — R768 Other specified abnormal immunological findings in serum: Secondary | ICD-10-CM

## 2022-09-19 MED ORDER — PREDNISONE 10 MG (48) PO TBPK: ORAL_TABLET | ORAL | 0 refills | Status: DC

## 2022-09-19 NOTE — Progress Notes (Signed)
Established patient visit   Patient: Christopher Stokes   DOB: 1986-07-02   36 y.o. Male  MRN: [id number] Visit Date: 09/19/2022   Chief Complaint  Patient presents with   Follow-up   Subjective    HPI  Follow up  -significant arthritis, especiallly in finger joints of both hands  -routine labs along with connnective tissue done since last visit.  --positive ANA and sed rate  --ENA RNP Ab elevated  --states that the pain is better today. Had been on prednisone taper when I initially saw him on 09/04/2022.  -celebrex was added twice daily. He feels this is more effective than other NSAIDs he has tried in the past.   -other labs  --moderate elevation of liver functions  --patient reports being a heavy alcohol drinker.  --states that he drinks about 6 bottles of beer everyday.     Medications: Outpatient Medications Prior to Visit  Medication Sig   acetaminophen (TYLENOL) 500 MG tablet Take 1,000 mg by mouth every 6 (six) hours as needed for mild pain.   celecoxib (CELEBREX) 200 MG capsule Take 1 capsule (200 mg total) by mouth 2 (two) times daily.   No facility-administered medications prior to visit.    Review of Systems  Constitutional:  Positive for fatigue. Negative for activity change, chills and fever.  HENT:  Negative for congestion, postnasal drip, rhinorrhea, sinus pressure, sinus pain, sneezing and sore throat.   Eyes: Negative.   Respiratory:  Negative for cough, shortness of breath and wheezing.   Cardiovascular:  Negative for chest pain and palpitations.  Gastrointestinal:  Negative for constipation, diarrhea, nausea and vomiting.  Endocrine: Negative for cold intolerance, heat intolerance, polydipsia and polyuria.  Genitourinary:  Negative for dysuria, frequency and urgency.  Musculoskeletal:  Positive for arthralgias, joint swelling and myalgias. Negative for back pain.       Most severe in joints of fingers and hands   Skin:  Negative for rash.   Allergic/Immunologic: Negative for environmental allergies.  Neurological:  Negative for dizziness, weakness and headaches.  Psychiatric/Behavioral:  The patient is not nervous/anxious.     Last CBC Lab Results  Component Value Date   WBC 13.7 (H) 09/04/2022   HGB 16.4 09/04/2022   HCT 48.6 09/04/2022   MCV 86 09/04/2022   MCH 29.0 09/04/2022   RDW 12.6 09/04/2022   PLT 369 59/16/3846   Last metabolic panel Lab Results  Component Value Date   GLUCOSE 94 09/04/2022   NA 139 09/04/2022   K 4.3 09/04/2022   CL 99 09/04/2022   CO2 26 09/04/2022   BUN 15 09/04/2022   CREATININE 0.94 09/04/2022   EGFR 110 09/04/2022   CALCIUM 9.8 09/04/2022   PROT 7.9 09/04/2022   ALBUMIN 4.3 09/04/2022   LABGLOB 3.6 09/04/2022   AGRATIO 1.2 09/04/2022   BILITOT 0.4 09/04/2022   ALKPHOS 83 09/04/2022   AST 112 (H) 09/04/2022   ALT 289 (H) 09/04/2022   ANIONGAP 12 03/01/2018   Last lipids Lab Results  Component Value Date   CHOL 184 09/04/2022   HDL 76 09/04/2022   LDLCALC 95 09/04/2022   TRIG 69 09/04/2022   CHOLHDL 2.4 09/04/2022   Last hemoglobin A1c No results found for: "HGBA1C" Last thyroid functions Lab Results  Component Value Date   TSH 0.866 09/04/2022       Objective     Today's Vitals   09/19/22 0900  BP: 123/79  Pulse: 89  SpO2: 96%  Weight: 196 lb 1.9  oz (89 kg)  Height: 5' 6"  (1.676 m)   Body mass index is 31.65 kg/m.  BP Readings from Last 3 Encounters:  09/19/22 123/79  09/04/22 116/80  09/17/21 136/86    Wt Readings from Last 3 Encounters:  09/19/22 196 lb 1.9 oz (89 kg)  09/04/22 194 lb 1.9 oz (88.1 kg)  09/17/21 194 lb (88 kg)    Physical Exam Vitals and nursing note reviewed.  Constitutional:      Appearance: Normal appearance. He is well-developed.  HENT:     Head: Normocephalic and atraumatic.     Nose: Nose normal.     Mouth/Throat:     Mouth: Mucous membranes are moist.     Pharynx: Oropharynx is clear.  Eyes:      Extraocular Movements: Extraocular movements intact.     Conjunctiva/sclera: Conjunctivae normal.     Pupils: Pupils are equal, round, and reactive to light.  Cardiovascular:     Rate and Rhythm: Normal rate and regular rhythm.     Pulses: Normal pulses.     Heart sounds: Normal heart sounds.  Pulmonary:     Effort: Pulmonary effort is normal.     Breath sounds: Normal breath sounds.  Abdominal:     Palpations: Abdomen is soft.  Musculoskeletal:        General: Normal range of motion.     Cervical back: Normal range of motion and neck supple.     Comments: Joint pain with inflammation of the joints of the fingers. Grip strength  weak, bilaterally.  Generalized joint pain with some point tenderness present. No abnormalities or deformities are noted.   Improved from most recent visit   Lymphadenopathy:     Cervical: No cervical adenopathy.  Skin:    General: Skin is warm and dry.     Capillary Refill: Capillary refill takes less than 2 seconds.  Neurological:     General: No focal deficit present.     Mental Status: He is alert and oriented to person, place, and time.  Psychiatric:        Mood and Affect: Mood normal.        Behavior: Behavior normal.        Thought Content: Thought content normal.        Judgment: Judgment normal.      Assessment & Plan    1. Inflammatory arthritis Repeat prednisone taper. Take as directed for 12 days. Continue celebrex twice daily as needed. Referral to rheumatology for further evaluation and treatment.  - predniSONE (STERAPRED UNI-PAK 48 TAB) 10 MG (48) TBPK tablet; 12 day taper - take by mouth as directed for 12 days  Dispense: 48 tablet; Refill: 0 - Ambulatory referral to Rheumatology  2. Elevated antinuclear antibody (ANA) level Refer to rheumatology for further evaluation and treatment  - Ambulatory referral to Rheumatology  3. Nonspecific abnormal results of liver function study Patient admits to being heavy alcohol user. Will try  to cut back consumption. RUQ ultrasound ordered for further evaluation and treatment - US Abdomen Limited RUQ (LIVER/GB); Future    Problem List Items Addressed This Visit       Other   Elevated antinuclear antibody (ANA) level   Relevant Orders   Ambulatory referral to Rheumatology   Nonspecific abnormal results of liver function study   Relevant Orders   US Abdomen Limited RUQ (LIVER/GB)   Other Visit Diagnoses     Inflammatory arthritis    -  Primary   Relevant Medications  predniSONE (STERAPRED UNI-PAK 48 TAB) 10 MG (48) TBPK tablet   Other Relevant Orders   Ambulatory referral to Rheumatology        Return in about 2 months (around 11/19/2022) for review ultrasound - may need referral to GI. Marland Kitchen         Ronnell Freshwater, NP  [facility] at [facility] [phone] (phone) [phone] (fax)  [place]

## 2022-09-21 DIAGNOSIS — R5383 Other fatigue: Secondary | ICD-10-CM | POA: Insufficient documentation

## 2022-09-21 DIAGNOSIS — L4050 Arthropathic psoriasis, unspecified: Secondary | ICD-10-CM | POA: Insufficient documentation

## 2022-09-21 DIAGNOSIS — M1990 Unspecified osteoarthritis, unspecified site: Secondary | ICD-10-CM | POA: Insufficient documentation

## 2022-10-06 DIAGNOSIS — R945 Abnormal results of liver function studies: Secondary | ICD-10-CM | POA: Insufficient documentation

## 2022-10-06 DIAGNOSIS — R768 Other specified abnormal immunological findings in serum: Secondary | ICD-10-CM | POA: Insufficient documentation

## 2022-11-19 ENCOUNTER — Ambulatory Visit (INDEPENDENT_AMBULATORY_CARE_PROVIDER_SITE_OTHER): Payer: Self-pay | Admitting: Nurse Practitioner

## 2022-11-19 ENCOUNTER — Encounter: Payer: Self-pay | Admitting: Nurse Practitioner

## 2022-11-19 VITALS — BP 130/91 | HR 71 | Resp 18 | Ht 66.0 in | Wt 203.0 lb

## 2022-11-19 DIAGNOSIS — M1990 Unspecified osteoarthritis, unspecified site: Secondary | ICD-10-CM

## 2022-11-19 DIAGNOSIS — R945 Abnormal results of liver function studies: Secondary | ICD-10-CM

## 2022-11-19 DIAGNOSIS — R768 Other specified abnormal immunological findings in serum: Secondary | ICD-10-CM

## 2022-11-19 MED ORDER — PREDNISONE 10 MG (48) PO TBPK: ORAL_TABLET | ORAL | 1 refills | Status: DC

## 2022-11-19 MED ORDER — CELECOXIB 200 MG PO CAPS: 200.0000 mg | ORAL_CAPSULE | Freq: Two times a day (BID) | ORAL | 3 refills | Status: DC

## 2022-11-19 NOTE — Progress Notes (Signed)
Established patient visit   Patient: Christopher Stokes   DOB: 10/28/1986   36 y.o. Male  MRN: [id number] Visit Date: 11/19/2022   Chief Complaint  Patient presents with   Follow-up   Elevated Hepatic Enzymes   Subjective    HPI  Follow up  -significant arthritis, especiallly in finger joints of both hands  -routine labs along with connnective tissue done since last visit.  --positive ANA and sed rate  --ENA RNP Ab elevated  --states that the pain is better today. Has had two prednisone tapers when I initially saw him on 09/04/2022.  -celebrex was added twice daily. He feels this is more effective than other NSAIDs he has tried in the past.  -does have appointment with rheumatology. This is not until 01/2023.   -other labs  --moderate elevation of liver functions  --patient reported being a heavy alcohol drinker.  --states that he drinks about 6 bottles of beer everyday.  ---since most recent visit he has cut down his alcohol intake from 6 bottles of beer every night to 4 bottles of beer.  ---has improved belly pain and improved energy ---an ultrasound had been ordered of RUQ, however, patient did not have this done because he currently does not have insurance.   Medications: Outpatient Medications Prior to Visit  Medication Sig   acetaminophen (TYLENOL) 500 MG tablet Take 1,000 mg by mouth every 6 (six) hours as needed for mild pain.   [DISCONTINUED] celecoxib (CELEBREX) 200 MG capsule Take 1 capsule (200 mg total) by mouth 2 (two) times daily.   [DISCONTINUED] predniSONE (STERAPRED UNI-PAK 48 TAB) 10 MG (48) TBPK tablet 12 day taper - take by mouth as directed for 12 days (Patient not taking: Reported on 11/19/2022)   No facility-administered medications prior to visit.    Review of Systems  Constitutional:  Negative for activity change, chills, fatigue and fever.  HENT:  Negative for congestion, postnasal drip, rhinorrhea, sinus pressure, sinus pain, sneezing and sore throat.    Eyes: Negative.   Respiratory:  Negative for cough, shortness of breath and wheezing.   Cardiovascular:  Negative for chest pain and palpitations.  Gastrointestinal:  Negative for constipation, diarrhea, nausea and vomiting.  Endocrine: Negative for cold intolerance, heat intolerance, polydipsia and polyuria.  Genitourinary:  Negative for dysuria, frequency and urgency.  Musculoskeletal:  Positive for arthralgias and myalgias. Negative for back pain.       Mostly in the  joints of the  fingers of both hands.    Skin:  Negative for rash.  Allergic/Immunologic: Negative for environmental allergies.  Neurological:  Negative for dizziness, weakness and headaches.  Psychiatric/Behavioral:  The patient is not nervous/anxious.     Last CBC Lab Results  Component Value Date   WBC 13.7 (H) 09/04/2022   HGB 16.4 09/04/2022   HCT 48.6 09/04/2022   MCV 86 09/04/2022   MCH 29.0 09/04/2022   RDW 12.6 09/04/2022   PLT 369 68/34/1962   Last metabolic panel Lab Results  Component Value Date   GLUCOSE 90 11/19/2022   NA 138 11/19/2022   K 4.3 11/19/2022   CL 101 11/19/2022   CO2 24 11/19/2022   BUN 11 11/19/2022   CREATININE 0.94 11/19/2022   EGFR 109 11/19/2022   CALCIUM 9.6 11/19/2022   PROT 7.5 11/19/2022   ALBUMIN 4.1 11/19/2022   LABGLOB 3.4 11/19/2022   AGRATIO 1.2 11/19/2022   BILITOT 0.8 11/19/2022   ALKPHOS 72 11/19/2022   AST 95 (H) 11/19/2022  ALT 249 (H) 11/19/2022   ANIONGAP 12 03/01/2018   Last lipids Lab Results  Component Value Date   CHOL 184 09/04/2022   HDL 76 09/04/2022   LDLCALC 95 09/04/2022   TRIG 69 09/04/2022   CHOLHDL 2.4 09/04/2022   Last hemoglobin A1c No results found for: "HGBA1C" Last thyroid functions Lab Results  Component Value Date   TSH 0.866 09/04/2022      Objective     Today's Vitals   11/19/22 1301 11/19/22 1517  BP: (Abnormal) 134/91 (Abnormal) 130/91  Pulse: 71   Resp: 18   SpO2: 98%   Weight: 203 lb (92.1 kg)    Height: _0  (1.676 m)    Body mass index is 32.77 kg/m.  BP Readings from Last 3 Encounters:  11/19/22 (Abnormal) 130/91  09/19/22 123/79  09/04/22 116/80    Wt Readings from Last 3 Encounters:  11/19/22 203 lb (92.1 kg)  09/19/22 196 lb 1.9 oz (89 kg)  09/04/22 194 lb 1.9 oz (88.1 kg)    Physical Exam Vitals and nursing note reviewed.  Constitutional:      Appearance: Normal appearance. He is well-developed.  HENT:     Head: Normocephalic.  Eyes:     Pupils: Pupils are equal, round, and reactive to light.  Cardiovascular:     Rate and Rhythm: Normal rate and regular rhythm.     Pulses: Normal pulses.     Heart sounds: Normal heart sounds.  Pulmonary:     Effort: Pulmonary effort is normal.     Breath sounds: Normal breath sounds.  Abdominal:     Palpations: Abdomen is soft.  Musculoskeletal:        General: Normal range of motion.     Cervical back: Normal range of motion and neck supple.     Comments: Joint pain with inflammation of the joints of the fingers. Grip strength  weak, bilaterally.  Generalized joint pain with some point tenderness present. No abnormalities or deformities are noted.   Improved from most recent visit     Lymphadenopathy:     Cervical: No cervical adenopathy.  Skin:    General: Skin is warm and dry.     Capillary Refill: Capillary refill takes less than 2 seconds.  Neurological:     General: No focal deficit present.     Mental Status: He is alert and oriented to person, place, and time.  Psychiatric:        Mood and Affect: Mood normal.        Behavior: Behavior normal.        Thought Content: Thought content normal.        Judgment: Judgment normal.      Results for orders placed or performed in visit on 11/19/22  Comp Met (CMET)  Result Value Ref Range   Glucose 90 70 - 99 mg/dL   BUN 11 6 - 20 mg/dL   Creatinine, Ser 0.94 0.76 - 1.27 mg/dL   eGFR 109 >59 mL/min/1.73   BUN/Creatinine Ratio 12 9 - 20   Sodium 138 134 -  144 mmol/L   Potassium 4.3 3.5 - 5.2 mmol/L   Chloride 101 96 - 106 mmol/L   CO2 24 20 - 29 mmol/L   Calcium 9.6 8.7 - 10.2 mg/dL   Total Protein 7.5 6.0 - 8.5 g/dL   Albumin 4.1 4.1 - 5.1 g/dL   Globulin, Total 3.4 1.5 - 4.5 g/dL   Albumin/Globulin Ratio 1.2 1.2 - 2.2  Bilirubin Total 0.8 0.0 - 1.2 mg/dL   Alkaline Phosphatase 72 44 - 121 IU/L   AST 95 (H) 0 - 40 IU/L   ALT 249 (H) 0 - 44 IU/L    Assessment & Plan    1. Inflammatory arthritis Conitnue celebrex 200 mg twice daily as needed for pain/inflammation. Renew prednisone taper. Take as directed for 6 days. Consult with rheumatology as scheduled.  - celecoxib (CELEBREX) 200 MG capsule; Take 1 capsule (200 mg total) by mouth 2 (two) times daily.  Dispense: 60 capsule; Refill: 3 - predniSONE (STERAPRED UNI-PAK 48 TAB) 10 MG (48) TBPK tablet; 12 day taper - take by mouth as directed for 12 days  Dispense: 48 tablet; Refill: 1  2. Elevated antinuclear antibody (ANA) level Consult  with rheumatology as scheduled.   3. Nonspecific abnormal results of liver function study Recheck CMP today.  - Comp Met (CMET)   Problem List Items Addressed This Visit       Musculoskeletal and Integument   Inflammatory arthritis - Primary   Relevant Medications   celecoxib (CELEBREX) 200 MG capsule   predniSONE (STERAPRED UNI-PAK 48 TAB) 10 MG (48) TBPK tablet     Other   Elevated antinuclear antibody (ANA) level   Nonspecific abnormal results of liver function study   Relevant Orders   Comp Met (CMET) (Completed)     Return in about 4 months (around 03/21/2023) for arthritis .         Ronnell Freshwater, NP  [facility] at [facility] [phone] (phone) [phone] (fax)  [place]

## 2022-11-20 LAB — COMPREHENSIVE METABOLIC PANEL
ALT: 249 IU/L — ABNORMAL HIGH (ref 0–44)
AST: 95 IU/L — ABNORMAL HIGH (ref 0–40)
Albumin/Globulin Ratio: 1.2 (ref 1.2–2.2)
Albumin: 4.1 g/dL (ref 4.1–5.1)
Alkaline Phosphatase: 72 IU/L (ref 44–121)
BUN/Creatinine Ratio: 12 (ref 9–20)
BUN: 11 mg/dL (ref 6–20)
Bilirubin Total: 0.8 mg/dL (ref 0.0–1.2)
CO2: 24 mmol/L (ref 20–29)
Calcium: 9.6 mg/dL (ref 8.7–10.2)
Chloride: 101 mmol/L (ref 96–106)
Creatinine, Ser: 0.94 mg/dL (ref 0.76–1.27)
Globulin, Total: 3.4 g/dL (ref 1.5–4.5)
Glucose: 90 mg/dL (ref 70–99)
Potassium: 4.3 mmol/L (ref 3.5–5.2)
Sodium: 138 mmol/L (ref 134–144)
Total Protein: 7.5 g/dL (ref 6.0–8.5)
eGFR: 109 mL/min/{1.73_m2} (ref >59)

## 2023-01-01 NOTE — Progress Notes (Signed)
Office Visit Note  Patient: Christopher Stokes             Date of Birth: November 11, 1986           MRN: [id number]             PCP: Ronnell Freshwater, NP Referring: Ronnell Freshwater, NP Visit Date: 01/02/2023 Occupation: Chef  Subjective:  Pain and Edema of the Right Hand, Pain and Edema of the Left Hand, Pain and Edema of the Left Foot, and Edema and Pain of the Right Foot   History of Present Illness: Christopher Stokes is a 37 y.o. male here for evaluation of pain and swelling affecting both hands and feet and abnormal labs with positive ANA.  Symptom onset was nonspecific for sought medical attention for this in October 2022 with joint pain swelling decreased range of motion most severely affected site at his right third finger initially.  He has had a couple episodic flareups of this over time which have been treated with oral prednisone with near complete resolution of symptoms while on the steroids and tends to stay improved for at least a few weeks before redeveloping symptoms.  He does have persistent symptoms in between flareups.  Symptoms are most severe with prolonged morning stiffness his flexibility increases somewhat over the course of the day.  Hands and feet are both affected but hands worst he is also been noticing a bit more daily symptoms with the colder temperature this winter.  He is also been prescribed Celebrex has tried using over-the-counter NSAIDs but does not notice of a large difference in symptoms. He is also noticed skin rash and redness ongoing for at least since before he is joint swelling symptoms began but with insidious or nonspecific onset.  Has rashes on his scalp in the bellybutton notices some on the skin creases in his groin.  He tried using an over-the-counter medicated shampoo but did not see a large difference in symptoms.  While taking the prednisone tapers he thinks the rashes were less itchy but never really resolved.  He has never seen dermatology for this  problem. He denies any new oral or nasal ulcers, lymphadenopathy, Raynaud's symptoms, or skin photosensitivity.  No history of abnormal bleeding or blood clots.  Labs reviewed 08/2022 ANA pos RNP 1.2 dsDNA, Sm, Scl-70, SSA, SSB, chromatin, Jo-1, centromere neg RF neg ESR 19 CRP 2 AST 112 ALT 289  08/2021 ANA neg RF neg CCP neg ESR 31 CRP 14 Uric acid 5.0  Activities of Daily Living:  Patient reports morning stiffness for 1-2 hour.   Patient Denies nocturnal pain.  Difficulty dressing/grooming: Denies Difficulty climbing stairs: Denies Difficulty getting out of chair: Reports Difficulty using hands for taps, buttons, cutlery, and/or writing: Reports  Review of Systems  Constitutional:  Negative for fatigue.  HENT:  Positive for mouth dryness. Negative for mouth sores.   Eyes:  Negative for dryness.  Respiratory:  Negative for shortness of breath.   Cardiovascular:  Negative for chest pain and palpitations.  Gastrointestinal:  Positive for blood in stool. Negative for constipation and diarrhea.  Endocrine: Negative for increased urination.  Genitourinary:  Negative for involuntary urination.  Musculoskeletal:  Positive for joint pain, joint pain, joint swelling, myalgias, morning stiffness and myalgias. Negative for gait problem, muscle weakness and muscle tenderness.  Skin:  Positive for rash. Negative for color change, hair loss and sensitivity to sunlight.  Allergic/Immunologic: Negative for susceptible to infections.  Neurological:  Negative for  dizziness and headaches.  Hematological:  Negative for swollen glands.  Psychiatric/Behavioral:  Negative for depressed mood and sleep disturbance. The patient is not nervous/anxious.     PMFS History:  Patient Active Problem List   Diagnosis Date Noted   Elevated antinuclear antibody (ANA) level 10/06/2022   Nonspecific abnormal results of liver function study 10/06/2022   Other fatigue 09/21/2022   Inflammatory  arthritis 09/21/2022   Acute appendicitis 03/01/2018    History reviewed. No pertinent past medical history.  Family History  Problem Relation Age of Onset   Diabetes Father    High Cholesterol Father    High blood pressure Father    High blood pressure Brother    High Cholesterol Brother    Past Surgical History:  Procedure Laterality Date   LAPAROSCOPIC APPENDECTOMY N/A 03/01/2018   Procedure: APPENDECTOMY LAPAROSCOPIC;  Surgeon: Kinsinger, Arta Bruce, MD;  Location: [facility];  Service: General;  Laterality: N/A;   Social History   Social History Narrative   Not on file    There is no immunization history on file for this patient.   Objective: Vital Signs: BP 128/80 (BP Location: Left Arm, Patient Position: Sitting, Cuff Size: Normal)   Pulse 79   Resp 15   Ht 5' 6.5" (1.689 m)   Wt 197 lb (89.4 kg)   BMI 31.32 kg/m    Physical Exam HENT:     Right Ear: External ear normal.     Left Ear: External ear normal.     Mouth/Throat:     Mouth: Mucous membranes are moist.     Pharynx: Oropharynx is clear.  Eyes:     Conjunctiva/sclera: Conjunctivae normal.  Cardiovascular:     Rate and Rhythm: Normal rate and regular rhythm.  Pulmonary:     Effort: Pulmonary effort is normal.     Breath sounds: Normal breath sounds.  Musculoskeletal:     Right lower leg: No edema.     Left lower leg: No edema.  Lymphadenopathy:     Cervical: No cervical adenopathy.  Skin:    General: Skin is warm and dry.     Findings: Rash present.     Comments: Erythematous skin rash across the front of the hairline with minimal overlying scaling, patchy erythematous rash also on the occiput near posterior hairline and some behind the ears Rash present in the umbilicus extending less than 1 cm on the surrounding abdomen also with mild overlying scaling Erythematous changes along the skin folds and groin on both sides with no scale No fingernail or toenail changes, normal-appearing nailfold  capillaroscopy  Neurological:     Mental Status: He is alert.  Psychiatric:        Mood and Affect: Mood normal.      Musculoskeletal Exam:  Shoulders full ROM no tenderness or swelling Elbows full ROM no tenderness or swelling Wrists full ROM no tenderness or swelling Diffuse swelling in the fingers with synovitis of multiple joints most severe decreased range of motion affecting the PIPs possible dactylitis of the right third digit, mild tenderness to pressure most prominent at the base of the right thumb Knees full ROM no tenderness or swelling Ankles full ROM no tenderness or swelling MTPs full ROM no tenderness or swelling   Investigation: No additional findings.  Imaging: XR Hand 2 View Left  Result Date: 01/02/2023 X-ray left hand 2 views Radiocarpal and carpal joint spaces appear normal.  MCP joints appear normal.  There is slight narrowing and marginal cystic  changes at the fourth DIP otherwise PIP and DIP joint spaces appear well-preserved.  No visible erosions and bone mineralization appears normal. 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  MCP joint spaces are normal.  Subchondral cyst changes at the margin of third DIP joint with no visible erosive changes or osteophytes.  Bone mineralization appears normal. Impression Very slight distal joint degenerative changes no evidence of erosive disease   Recent Labs: Lab Results  Component Value Date   WBC 13.7 (H) 09/04/2022   HGB 16.4 09/04/2022   PLT 369 09/04/2022   NA 138 11/19/2022   K 4.3 11/19/2022   CL 101 11/19/2022   CO2 24 11/19/2022   GLUCOSE 90 11/19/2022   BUN 11 11/19/2022   CREATININE 0.94 11/19/2022   BILITOT 0.8 11/19/2022   ALKPHOS 72 11/19/2022   AST 95 (H) 11/19/2022   ALT 249 (H) 11/19/2022   PROT 7.5 11/19/2022   ALBUMIN 4.1 11/19/2022   CALCIUM 9.6 11/19/2022    GFRAA >60 03/01/2018    Speciality Comments: No specialty comments available.  Procedures:  No procedures performed Allergies: Patient has no known allergies.   Assessment / Plan:     Visit Diagnoses: Inflammatory arthritis - Plan: XR Hand 2 View Right, XR Hand 2 View Left  Clinical picture looks most concerning for new psoriatic arthritis.  X-ray of hands does not show any erosive disease changes. There is very minimal osteoarthritis to account for symptoms and inspection in clinic including ultrasound exam is definitive for synovitis and may also have dactylitis of the right third finger.  Unfortunately reports very little benefit with the NSAIDs both over-the-counter and prescribed so far.  I recommend him to take the previously prescribed prednisone taper before next visit so we can see his response to treatment.  Getting baseline lab workup for long-term DMARD treatment options.  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  Never specifically diagnosed previous but rashes appear to have proceeded inflammatory joint symptoms by at least months.  I discussed if we do start treatment that is effective for his arthritis I would expect skin disease to also improve if not progressing as expected may need to refer to dermatology.  Elevated antinuclear antibody (ANA) level  I suspect the positive ANA is incidental or nonspecific finding along with the very low positive RNP antibody.  He has inflammatory arthritis and skin rashes no other specific clinical criteria by history and exam and I think the joint and skin disease is better explained by alternative process.  High risk medication use - Plan: Hepatitis C antibody, Hepatitis B surface antigen, Hepatic function panel  Abnormal liver function tests but he was taking NSAIDs and Tylenol at the time we will repeat these today.   If significantly abnormal may limit our treatment options.  Also screening for hepatitis B and C chest for baseline but does not have a concerning clinical history.  Orders: Orders Placed This Encounter  Procedures   XR Hand 2 View Right   XR Hand 2 View Left   Hepatitis C antibody   Hepatitis B surface antigen   Hepatic function panel   No orders of the defined types were placed in this encounter.    Follow-Up Instructions: Return in about  3 weeks (around 01/23/2023) for New pt ?PsA f/u 3wks or next after.   Fuller Plan, MD  Note - This record has been created using [employer].  Chart creation errors have been sought, but may not always  have been located. Such creation errors do not reflect on  the standard of medical care.

## 2023-01-02 ENCOUNTER — Ambulatory Visit: Payer: Self-pay

## 2023-01-02 ENCOUNTER — Ambulatory Visit: Payer: Self-pay | Attending: Internal Medicine | Admitting: Internal Medicine

## 2023-01-02 ENCOUNTER — Encounter: Payer: Self-pay | Admitting: Internal Medicine

## 2023-01-02 ENCOUNTER — Ambulatory Visit (INDEPENDENT_AMBULATORY_CARE_PROVIDER_SITE_OTHER): Payer: Self-pay

## 2023-01-02 VITALS — BP 128/80 | HR 79 | Resp 15 | Ht 66.5 in | Wt 197.0 lb

## 2023-01-02 DIAGNOSIS — R768 Other specified abnormal immunological findings in serum: Secondary | ICD-10-CM

## 2023-01-02 DIAGNOSIS — Z79899 Other long term (current) drug therapy: Secondary | ICD-10-CM

## 2023-01-02 DIAGNOSIS — R21 Rash and other nonspecific skin eruption: Secondary | ICD-10-CM | POA: Insufficient documentation

## 2023-01-02 DIAGNOSIS — M1990 Unspecified osteoarthritis, unspecified site: Secondary | ICD-10-CM

## 2023-01-02 DIAGNOSIS — M79642 Pain in left hand: Secondary | ICD-10-CM

## 2023-01-02 DIAGNOSIS — M79641 Pain in right hand: Secondary | ICD-10-CM

## 2023-01-04 LAB — HEPATIC FUNCTION PANEL
AG Ratio: 1 (calc) (ref 1.0–2.5)
ALT: 380 U/L — ABNORMAL HIGH (ref 9–46)
AST: 219 U/L — ABNORMAL HIGH (ref 10–40)
Albumin: 4.3 g/dL (ref 3.6–5.1)
Alkaline phosphatase (APISO): 63 U/L (ref 36–130)
Bilirubin, Direct: 0.1 mg/dL (ref 0.0–0.2)
Globulin: 4.1 g/dL (calc) — ABNORMAL HIGH (ref 1.9–3.7)
Indirect Bilirubin: 0.4 mg/dL (calc) (ref 0.2–1.2)
Total Bilirubin: 0.5 mg/dL (ref 0.2–1.2)
Total Protein: 8.4 g/dL — ABNORMAL HIGH (ref 6.1–8.1)

## 2023-01-04 LAB — HEPATITIS B SURFACE ANTIGEN: Hepatitis B Surface Ag: NONREACTIVE

## 2023-01-04 LAB — HEPATITIS C ANTIBODY: Hepatitis C Ab: NONREACTIVE

## 2023-01-05 NOTE — Progress Notes (Signed)
Lab result shows increased elevated of liver enzymes again. I am not sure about the cause for this but these are more than 5 times normal and may need further investigation. It looks like your PCP ordered a liver ultrasound last year, was this ever scheduled? If not that would usually be the next test to check this.

## 2023-01-28 NOTE — Progress Notes (Addendum)
Office Visit Note  Patient: Christopher Stokes             Date of Birth: 1986-06-19           MRN: [id number]             PCP: Ronnell Freshwater, NP Referring: Ronnell Freshwater, NP Visit Date: 01/29/2023   Subjective:  Follow-up (Patient states he did not get an ultrasound for his liver. )   History of Present Illness: Christopher Stokes is a 37 y.o. male here for follow up for psoriatic arthritis workup at initial visit demonstrated markedly elevated liver function test.  He has not yet scheduled for the recommend right upper quadrant ultrasound to evaluate this.  Within the past weeks had progressive worsening of swelling in both knees pain with weightbearing continues to have swelling throughout multiple fingers of both hands.  He is taking the Celebrex consistently which is partially helpful.  No noticeable change in skin rashes.  Previous HPI 01/02/23 Christopher Stokes is a 37 y.o. male here for evaluation of pain and swelling affecting both hands and feet and abnormal labs with positive ANA.  Symptom onset was nonspecific for sought medical attention for this in October 2022 with joint pain swelling decreased range of motion most severely affected site at his right third finger initially.  He has had a couple episodic flareups of this over time which have been treated with oral prednisone with near complete resolution of symptoms while on the steroids and tends to stay improved for at least a few weeks before redeveloping symptoms.  He does have persistent symptoms in between flareups.  Symptoms are most severe with prolonged morning stiffness his flexibility increases somewhat over the course of the day.  Hands and feet are both affected but hands worst he is also been noticing a bit more daily symptoms with the colder temperature this winter.  He is also been prescribed Celebrex has tried using over-the-counter NSAIDs but does not notice of a large difference in symptoms. He is also noticed skin  rash and redness ongoing for at least since before he is joint swelling symptoms began but with insidious or nonspecific onset.  Has rashes on his scalp in the bellybutton notices some on the skin creases in his groin.  He tried using an over-the-counter medicated shampoo but did not see a large difference in symptoms.  While taking the prednisone tapers he thinks the rashes were less itchy but never really resolved.  He has never seen dermatology for this problem. He denies any new oral or nasal ulcers, lymphadenopathy, Raynaud's symptoms, or skin photosensitivity.  No history of abnormal bleeding or blood clots.   Labs reviewed 08/2022 ANA pos RNP 1.2 dsDNA, Sm, Scl-70, SSA, SSB, chromatin, Jo-1, centromere neg RF neg ESR 19 CRP 2 AST 112 ALT 289   08/2021 ANA neg RF neg CCP neg ESR 31 CRP 14 Uric acid 5.0   Review of Systems  Constitutional:  Positive for fatigue.  HENT:  Negative for mouth sores and mouth dryness.   Eyes:  Negative for dryness.  Respiratory:  Negative for shortness of breath.   Cardiovascular:  Negative for chest pain and palpitations.  Gastrointestinal:  Positive for blood in stool. Negative for constipation and diarrhea.  Endocrine: Negative for increased urination.  Genitourinary:  Negative for involuntary urination.  Musculoskeletal:  Positive for joint pain, joint pain, joint swelling, myalgias, muscle weakness, morning stiffness, muscle tenderness and myalgias. Negative for  gait problem.  Skin:  Positive for rash. Negative for color change, hair loss and sensitivity to sunlight.  Allergic/Immunologic: Negative for susceptible to infections.  Neurological:  Negative for dizziness and headaches.  Hematological:  Negative for swollen glands.  Psychiatric/Behavioral:  Negative for depressed mood and sleep disturbance. The patient is not nervous/anxious.     PMFS History:  Patient Active Problem List   Diagnosis Date Noted   High risk medication use  01/29/2023   Rash and other nonspecific skin eruption 01/02/2023   Elevated antinuclear antibody (ANA) level 10/06/2022   Nonspecific abnormal results of liver function study 10/06/2022   Other fatigue 09/21/2022   Psoriatic arthritis ([place]) 09/21/2022   Acute appendicitis 03/01/2018    History reviewed. No pertinent past medical history.  Family History  Problem Relation Age of Onset   Diabetes Father    High Cholesterol Father    High blood pressure Father    High blood pressure Brother    High Cholesterol Brother    Past Surgical History:  Procedure Laterality Date   LAPAROSCOPIC APPENDECTOMY N/A 03/01/2018   Procedure: APPENDECTOMY LAPAROSCOPIC;  Surgeon: Kinsinger, Arta Bruce, MD;  Location: [place];  Service: General;  Laterality: N/A;   Social History   Social History Narrative   Not on file    There is no immunization history on file for this patient.   Objective: Vital Signs: BP 131/88 (BP Location: Left Arm, Patient Position: Sitting, Cuff Size: Normal)   Pulse 86   Resp 14   Ht '5\' 6"'$  (1.676 m)   Wt 201 lb (91.2 kg)   BMI 32.44 kg/m    Physical Exam Cardiovascular:     Rate and Rhythm: Normal rate and regular rhythm.  Pulmonary:     Effort: Pulmonary effort is normal.     Breath sounds: Normal breath sounds.  Musculoskeletal:     Right lower leg: No edema.     Left lower leg: No edema.  Skin:    Findings: Rash present.     Comments: Erythematous skin rash along front hairline and over occiput with mild scaling Normal appearing nailfold capillaries  Neurological:     Mental Status: He is alert.      Musculoskeletal Exam:  Shoulders full ROM no tenderness or swelling Elbows full ROM no tenderness or swelling Wrists full ROM no tenderness or swelling Fingers synovitis throughout multiple joints most severe in PIPs with decreased flexion range of motion somewhat diffuse swelling especially the third finger suggestive for dactylitis Bilateral knee  effusions with mild tenderness to pressure decreased flexion range of motion Ankles full ROM no tenderness or swelling   Investigation: No additional findings.  Imaging: XR Hand 2 View Left  Result Date: 01/02/2023 X-ray left hand 2 views Radiocarpal and carpal joint spaces appear normal.  MCP joints appear normal.  There is slight narrowing and marginal cystic changes at the fourth DIP otherwise PIP and DIP joint spaces appear well-preserved.  No visible erosions and bone mineralization appears normal. 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  MCP joint spaces are normal.  Subchondral cyst changes at the margin of third DIP joint with no visible erosive changes or osteophytes.  Bone mineralization appears normal. Impression Very slight distal joint degenerative changes no evidence of erosive disease   Recent Labs: Lab Results  Component Value Date   WBC 13.7 (H) 09/04/2022  HGB 16.4 09/04/2022   PLT 369 09/04/2022   NA 138 11/19/2022   K 4.3 11/19/2022   CL 101 11/19/2022   CO2 24 11/19/2022   GLUCOSE 90 11/19/2022   BUN 11 11/19/2022   CREATININE 0.94 11/19/2022   BILITOT 0.5 01/02/2023   ALKPHOS 72 11/19/2022   AST 219 (H) 01/02/2023   ALT 380 (H) 01/02/2023   PROT 8.4 (H) 01/02/2023   ALBUMIN 4.1 11/19/2022   CALCIUM 9.6 11/19/2022   GFRAA >60 03/01/2018    Speciality Comments: No specialty comments available.  Procedures:  No procedures performed Allergies: Patient has no known allergies.   Assessment / Plan:     Visit Diagnoses: Psoriatic arthritis ([place]) - Plan: predniSONE (DELTASONE) 10 MG tablet  Appears to have very active joint disease with synovitis throughout both hands and large knee effusions with a lot of pain and limitation in mobility.  Not a good candidate for oral DMARD with significantly abnormal baseline liver function  test.  Plan to try starting on Cosentyx with much less risk for affecting metabolic function.  Will have delayed due to patient assistance program needed.  Also prescribed prednisone taper starting at 30 mg dose down over next 1 month for symptom control in the meantime.  Rash and other nonspecific skin eruption  Ongoing rashes limited to scalp and also some around umbilicus without large plaques.  Seems mild to maybe moderate severity skin disease activity.  High risk medication use - Plan: QuantiFERON-TB Gold Plus Nonspecific abnormal results of liver function study  Reviewed his abnormal lab test negative for viral hepatitis screening but liver function enzymes were worse than in PCP office.  Discussed he should try to avoid alcohol entirely at this time until there is some improvement or cause identified.  Also agree with getting upper quadrant ultrasound due to values about 6 times normal.  Checking QuantiFERON baseline screening for biologic medication.  Discussed risk of medication including injection site reactions and increased risk of infections.  Orders: Orders Placed This Encounter  Procedures   QuantiFERON-TB Gold Plus   Meds ordered this encounter  Medications   predniSONE (DELTASONE) 10 MG tablet    Sig: Take 3 tablets (30 mg total) by mouth daily with breakfast for 7 days, THEN 2 tablets (20 mg total) daily with breakfast for 7 days, THEN 1 tablet (10 mg total) daily with breakfast for 14 days.    Dispense:  49 tablet    Refill:  0     Follow-Up Instructions: Return in about 2 months (around 03/30/2023) for PsA GC taper/COS start f/u 65mo.   CCollier Salina MD  Note - This record has been created using [employer]  Chart creation errors have been sought, but may not always  have been located. Such creation errors do not reflect on  the standard of medical care.

## 2023-01-29 ENCOUNTER — Ambulatory Visit: Payer: Self-pay | Attending: Internal Medicine | Admitting: Internal Medicine

## 2023-01-29 ENCOUNTER — Telehealth: Payer: Self-pay | Admitting: Pharmacist

## 2023-01-29 ENCOUNTER — Encounter: Payer: Self-pay | Admitting: Internal Medicine

## 2023-01-29 VITALS — BP 131/88 | HR 86 | Resp 14 | Ht 66.0 in | Wt 201.0 lb

## 2023-01-29 DIAGNOSIS — R21 Rash and other nonspecific skin eruption: Secondary | ICD-10-CM

## 2023-01-29 DIAGNOSIS — L4050 Arthropathic psoriasis, unspecified: Secondary | ICD-10-CM

## 2023-01-29 DIAGNOSIS — R945 Abnormal results of liver function studies: Secondary | ICD-10-CM

## 2023-01-29 DIAGNOSIS — Z79899 Other long term (current) drug therapy: Secondary | ICD-10-CM | POA: Insufficient documentation

## 2023-01-29 MED ORDER — PREDNISONE 10 MG PO TABS: ORAL_TABLET | ORAL | 0 refills | Status: AC

## 2023-01-29 NOTE — Telephone Encounter (Signed)
Patient had OV with Dr. Benjamine Mola. He will be Cosentyx new start. He is uninsured - so no PA required.  Patient portion of [employer] PaP completed in clinic. Patient will drop off copy of 2023 federal tax return.  Provider portion placed in Dr. Marveen Reeks folder for signature today  Knox Saliva, PharmD, MPH, BCPS, CPP Clinical Pharmacist (Rheumatology and Pulmonology)

## 2023-01-29 NOTE — Progress Notes (Signed)
Pharmacy Note  Subjective:  Patient presents today to [facility] for follow up office visit.   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  History of inflammatory bowel disease: No  Objective:  CBC    Component Value Date/Time   WBC 13.7 (H) 09/04/2022 1028   WBC 21.2 (H) 03/01/2018 1231   RBC 5.66 09/04/2022 1028   RBC 5.77 03/01/2018 1231   HGB 16.4 09/04/2022 1028   HCT 48.6 09/04/2022 1028   PLT 369 09/04/2022 1028   MCV 86 09/04/2022 1028   MCH 29.0 09/04/2022 1028   MCH 28.9 03/01/2018 1231   MCHC 33.7 09/04/2022 1028   MCHC 33.4 03/01/2018 1231   RDW 12.6 09/04/2022 1028   LYMPHSABS 3.1 09/17/2021 0954   EOSABS 0.2 09/17/2021 0954   BASOSABS 0.1 09/17/2021 0954    CMP     Component Value Date/Time   NA 138 11/19/2022 1325   K 4.3 11/19/2022 1325   CL 101 11/19/2022 1325   CO2 24 11/19/2022 1325   GLUCOSE 90 11/19/2022 1325   GLUCOSE 124 (H) 03/01/2018 1231   BUN 11 11/19/2022 1325   CREATININE 0.94 11/19/2022 1325   CALCIUM 9.6 11/19/2022 1325   PROT 8.4 (H) 01/02/2023 0903   PROT 7.5 11/19/2022 1325   ALBUMIN 4.1 11/19/2022 1325   AST 219 (H) 01/02/2023 0903   ALT 380 (H) 01/02/2023 0903   ALKPHOS 72 11/19/2022 1325   BILITOT 0.5 01/02/2023 0903   BILITOT 0.8 11/19/2022 1325   GFRNONAA >60 03/01/2018 1231   GFRAA >60 03/01/2018 1231    Baseline Immunosuppressant Therapy Labs TB GOLD   Hepatitis Panel    Latest Ref Rng & Units 01/02/2023    9:03 AM  Hepatitis  Hep B Surface Ag NON-REACTIVE NON-REACTIVE   Hep C Ab NON-REACTIVE NON-REACTIVE    HIV No results found for: "HIV" Immunoglobulins   SPEP    Latest Ref Rng & Units 01/02/2023    9:03 AM  Serum Protein Electrophoresis  Total Protein 6.1 - 8.1 g/dL 8.4    Assessment/Plan:  Counseled patient that Cosentyx is a IL-17 inhibitor.  Counseled patient on purpose, proper use, and adverse effects of Cosentyx.  Reviewed the most common adverse effects of infection (more commonly nasopharyngitis, URTI), inflammatory bowel disease, and allergic reaction.  Counseled patient that Cosentyx should be held prior to scheduled surgery.  Counseled patient to avoid live vaccines while on Cosentyx.  Recommend annual influenza, PCV 15 or PCV20 or Pneumovax 23, and Shingrix as indicated.  Reviewed the importance of regular labs while on Cosentyx.  Will monitor CBC and CMP 1 month after starting and every 3 months routinely thereafter. Will monitor TB gold annually.  Standing orders placed.  Provided patient with medication education material and answered all questions.  Patient consented to Cosentyx.  Will upload consent into patient's chart.  Will apply for Cosentyx through patient's insurance.  Reviewed storage information for Cosentyx.  Advised initial injection must be administered in office.  Patient voiced understanding.    Patient dose will be for plaque psoriasis +/- psoriatic arthritis 300 mg every 7 days for 5 weeks then 300 mg every 28 days.   Patient is uninsured. We have completed patient portion of PAP application today. Provider portion placed with Dr. Benjamine Mola. Patient will drop off copy of 2023 federal tax return to clinic  Knox Saliva, PharmD, MPH, BCPS, CPP Clinical Pharmacist (Rheumatology and Pulmonology)

## 2023-01-29 NOTE — Patient Instructions (Addendum)
To avoid long term damage I recommend trying to stop or reduce alcohol use as much as possible with abnormal liver function at least in the short term.  PLEASE DROP OFF A COPY OF YOUR MOST RECENT FEDERAL TAX RETURN  Secukinumab Injection What is this medication? SECUKINUMAB (sek ue KIN ue mab) treats autoimmune conditions, such as psoriasis and arthritis. It works by slowing down an overactive immune system. It is a monoclonal antibody. This medicine may be used for other purposes; ask your health care provider or pharmacist if you have questions. COMMON BRAND NAME(S): Cosentyx What should I tell my care team before I take this medication? They need to know if you have any of these conditions: Crohn's disease, ulcerative colitis, or other inflammatory bowel disease Immune system problems Infection or history of infection, such as a viral infection, chickenpox, cold sores, or herpes Recently received or are scheduled to receive a vaccine Tuberculosis, a positive skin test for tuberculosis, or recent close contact with someone who has tuberculosis An unusual or allergic reaction to secukinumab, latex, rubber, other medications, foods, dyes, or preservatives Pregnant or trying to get pregnant Breast-feeding How should I use this medication? This medication is injected into a vein or under the skin. It is given by your care team in a hospital or clinic setting if it is injected into a vein. If it is injected under the skin, it may be given at home. If you get this medication at home, you will be taught how to prepare and give it. Use it exactly as directed. Take it as directed on the prescription label. Keep taking it unless your care team tells you to stop. It is important that you put your used needles and syringes in a special sharps container. Do not put them in a trash can. If you do not have a sharps container, call your pharmacist or care team to get one. A special MedGuide will be given to  you by the pharmacist with each prescription and refill. Be sure to read this information carefully each time. Talk to your care team about the use of this medication in children. While it may be prescribed for children as young as 2 years for selected conditions, precautions do apply. Overdosage: If you think you have taken too much of this medicine contact a poison control center or emergency room at once. NOTE: This medicine is only for you. Do not share this medicine with others. What if I miss a dose? If you get this medication at the hospital or clinic: It is important not to miss your dose. Call your care team if you are unable to keep an appointment. If you give yourself this medication at home: If you miss a dose, take it as soon as you can. If it is almost time for your next dose, take only that dose. Do not take double or extra doses. Call your care team with questions. What may interact with this medication? Live virus vaccines This list may not describe all possible interactions. Give your health care provider a list of all the medicines, herbs, non-prescription drugs, or dietary supplements you use. Also tell them if you smoke, drink alcohol, or use illegal drugs. Some items may interact with your medicine. What should I watch for while using this medication? Visit your care team for regular checks on your progress. Tell your care team if your symptoms do not start to get better or if they get worse. You will be tested for  tuberculosis (TB) before you start this medication. If your care team prescribes any medication for TB, you should start taking the TB medication before starting this medication. Make sure to finish the full course of TB medication. This medication may increase your risk of getting an infection. Call your care team for advice if you get a fever, chills, sore throat, or other symptoms of a cold or flu. Do not treat yourself. Try to avoid being around people who are  sick. This medication can decrease the response to a vaccine. If you need to get vaccinated, tell your care team if you have received this medication within the last 6 months. Extra booster doses may be needed. Talk to your care team to see if a different vaccination schedule is needed. What side effects may I notice from receiving this medication? Side effects that you should report to your care team as soon as possible: Allergic reactions--skin rash, itching, hives, swelling of the face, lips, tongue, or throat Dry, itchy, scaly patches of skin that blister or peel Infection--fever, chills, cough, sore throat, wounds that don't heal, pain or trouble when passing urine, general feeling of discomfort or being unwell Sudden or severe stomach pain, bloody diarrhea, fever, nausea, vomiting Side effects that usually do not require medical attention (report these to your care team if they continue or are bothersome): Diarrhea Runny or stuffy nose Sore throat This list may not describe all possible side effects. Call your doctor for medical advice about side effects. You may report side effects to FDA at 1-800-FDA-1088. Where should I keep my medication? Keep out of the reach of children and pets. Store in the refrigerator. Do not freeze. Keep it in the original carton until you are ready to use it. Protect from light. Do not shake. Remove the dose from the refrigerator about 30 minutes before it is time for you to use it. Use it within 4 days of removing it from the carton. Get rid of any unused medication after the expiration date. To get rid of medications that are no longer needed or have expired: Take the medication to a medication take-back program. Check with your pharmacy or law enforcement to find a location. If you cannot return the medication, ask your pharmacist or care team how to get rid of this medication safely. NOTE: This sheet is a summary. It may not cover all possible information. If  you have questions about this medicine, talk to your doctor, pharmacist, or health care provider.  2023 Elsevier/Gold Standard (2022-02-05 00:00:00)

## 2023-01-31 LAB — QUANTIFERON-TB GOLD PLUS
Mitogen-NIL: 7.9 IU/mL
NIL: 0.07 IU/mL
QuantiFERON-TB Gold Plus: NEGATIVE
TB1-NIL: 0 IU/mL
TB2-NIL: 0.01 IU/mL

## 2023-02-02 NOTE — Telephone Encounter (Signed)
Received signed provider form of [employer] PAP application for Cosentyx. Submission is pending patient's income docs  Knox Saliva, PharmD, MPH, BCPS, CPP Clinical Pharmacist (Rheumatology and Pulmonology)

## 2023-02-11 NOTE — Telephone Encounter (Signed)
ATC patient regarding income documents required for [employer] PAP application. Mailbox is full and unable to leave VM  Knox Saliva, PharmD, MPH, BCPS, CPP Clinical Pharmacist (Rheumatology and Pulmonology)

## 2023-02-17 NOTE — Telephone Encounter (Signed)
Second MyChart message sent to pt for income document request for Cosentyx PAP application  Knox Saliva, PharmD, MPH, BCPS, CPP Clinical Pharmacist (Rheumatology and Pulmonology)

## 2023-02-24 NOTE — Telephone Encounter (Signed)
Received income documents from patient. Submitted Patient Assistance Application to [employer] for [employer] along with provider portion, patient portion, med list, and income documents. Will update patient when we receive a response.  Phone: [phone] Fax: [phone]  Knox Saliva, PharmD, MPH, BCPS, CPP Clinical Pharmacist (Rheumatology and Pulmonology)

## 2023-02-26 NOTE — Telephone Encounter (Signed)
Received a fax from  [employer] regarding an approval for [place] patient assistance from 02/24/23 to 02/24/24. Approval letter sent to scan center.  [employer] phone: [phone]  Patient can be scheduled for Cosentyx new start appt. ATC patient - unable to reach. VM box is full so unable to leave VM  MyChart message has been sent to pt today for next steps  Knox Saliva, PharmD, MPH, BCPS, CPP Clinical Pharmacist (Rheumatology and Pulmonology)

## 2023-03-02 NOTE — Telephone Encounter (Signed)
Spoke with patient regarding Cosentyx approval. He states he is planning to call today and set up shipment. He will let me know once shipment is scheduled so we can schedule new start visit accordingly  Knox Saliva, PharmD, MPH, BCPS, CPP Clinical Pharmacist (Rheumatology and Pulmonology)

## 2023-03-03 ENCOUNTER — Telehealth: Payer: Self-pay | Admitting: *Deleted

## 2023-03-03 NOTE — Telephone Encounter (Signed)
Cosentyx received in office today and placed in Fridge #2.

## 2023-03-09 NOTE — Telephone Encounter (Signed)
ATC patiet to schedule Cosentyx new start. Unable to reach and VM box is full.  MyChart message sent to pt  Chesley Mires, PharmD, MPH, BCPS, CPP Clinical Pharmacist (Rheumatology and Pulmonology)

## 2023-03-09 NOTE — Telephone Encounter (Signed)
I think we can go ahead with starting cosentyx do not have to wait for ultrasound.

## 2023-03-12 NOTE — Telephone Encounter (Signed)
ATC patient regarding Cosentyx new start. Unable to reach and unable to leave VM as VM box is full.  Another MyChart message has been sent to pt  Christopher Stokes, PharmD, MPH, BCPS, CPP Clinical Pharmacist (Rheumatology and Pulmonology)

## 2023-03-23 ENCOUNTER — Ambulatory Visit (INDEPENDENT_AMBULATORY_CARE_PROVIDER_SITE_OTHER): Payer: Self-pay | Admitting: Nurse Practitioner

## 2023-03-23 DIAGNOSIS — R945 Abnormal results of liver function studies: Secondary | ICD-10-CM

## 2023-03-23 DIAGNOSIS — Z91199 Patient's noncompliance with other medical treatment and regimen due to unspecified reason: Secondary | ICD-10-CM

## 2023-03-23 NOTE — Progress Notes (Signed)
Established patient visit   Patient: Christopher Stokes   DOB: Jul 12, 1986   37 y.o. Male  MRN: [id number] Visit Date: 03/23/2023   No chief complaint on file.  Subjective    HPI  Follow up  -auto-immune arthritis.  --taking celebrex and tylenol as needed and as indicated to reduce pain/inflammation. Has been diagnosed with psoriatic arthritis.  -liver functions had been elevated in the past.  --admitted being a heavy drinker, which he immediately cut way back on.  ---needs to have recheck on liver functions - consider referral to GI     Medications: Outpatient Medications Prior to Visit  Medication Sig   acetaminophen (TYLENOL) 500 MG tablet Take 1,000 mg by mouth every 6 (six) hours as needed for mild pain. (Patient not taking: Reported on 01/02/2023)   celecoxib (CELEBREX) 200 MG capsule Take 1 capsule (200 mg total) by mouth 2 (two) times daily.   No facility-administered medications prior to visit.    Review of Systems     Objective    There were no vitals filed for this visit. There is no height or weight on file to calculate BMI.  BP Readings from Last 3 Encounters:  01/29/23 131/88  01/02/23 128/80  11/19/22 (Abnormal) 130/91    Wt Readings from Last 3 Encounters:  01/29/23 201 lb (91.2 kg)  01/02/23 197 lb (89.4 kg)  11/19/22 203 lb (92.1 kg)    Physical Exam    No results found for any visits on 03/23/23.  Assessment & Plan    There are no diagnoses linked to this encounter.   No follow-ups on file.         Carlean Jews, NP  [facility] [phone] (phone) [phone] (fax)  [facility]

## 2023-03-23 NOTE — Telephone Encounter (Signed)
I hope he shows up! Thanks.

## 2023-03-29 NOTE — Progress Notes (Deleted)
Office Visit Note  Patient: Christopher Stokes             Date of Birth: 1986/02/07           MRN: [id number]             PCP: Carlean Jews, NP Referring: Carlean Jews, NP Visit Date: 03/30/2023   Subjective:  No chief complaint on file.   History of Present Illness: Christopher Stokes is a 37 y.o. male here for follow up ***   Previous HPI 01/29/23 Christopher Stokes is a 37 y.o. male here for follow up for psoriatic arthritis workup at initial visit demonstrated markedly elevated liver function test.  He has not yet scheduled for the recommend right upper quadrant ultrasound to evaluate this.  Within the past weeks had progressive worsening of swelling in both knees pain with weightbearing continues to have swelling throughout multiple fingers of both hands.  He is taking the Celebrex consistently which is partially helpful.  No noticeable change in skin rashes.   Previous HPI 01/02/23 Christopher Stokes is a 37 y.o. male here for evaluation of pain and swelling affecting both hands and feet and abnormal labs with positive ANA.  Symptom onset was nonspecific for sought medical attention for this in October 2022 with joint pain swelling decreased range of motion most severely affected site at his right third finger initially.  He has had a couple episodic flareups of this over time which have been treated with oral prednisone with near complete resolution of symptoms while on the steroids and tends to stay improved for at least a few weeks before redeveloping symptoms.  He does have persistent symptoms in between flareups.  Symptoms are most severe with prolonged morning stiffness his flexibility increases somewhat over the course of the day.  Hands and feet are both affected but hands worst he is also been noticing a bit more daily symptoms with the colder temperature this winter.  He is also been prescribed Celebrex has tried using over-the-counter NSAIDs but does not notice of a large difference  in symptoms. He is also noticed skin rash and redness ongoing for at least since before he is joint swelling symptoms began but with insidious or nonspecific onset.  Has rashes on his scalp in the bellybutton notices some on the skin creases in his groin.  He tried using an over-the-counter medicated shampoo but did not see a large difference in symptoms.  While taking the prednisone tapers he thinks the rashes were less itchy but never really resolved.  He has never seen dermatology for this problem. He denies any new oral or nasal ulcers, lymphadenopathy, Raynaud's symptoms, or skin photosensitivity.  No history of abnormal bleeding or blood clots.   Labs reviewed 08/2022 ANA pos RNP 1.2 dsDNA, Sm, Scl-70, SSA, SSB, chromatin, Jo-1, centromere neg RF neg ESR 19 CRP 2 AST 112 ALT 289   08/2021 ANA neg RF neg CCP neg ESR 31 CRP 14 Uric acid 5.0   No Rheumatology ROS completed.   PMFS History:  Patient Active Problem List   Diagnosis Date Noted   High risk medication use 01/29/2023   Rash and other nonspecific skin eruption 01/02/2023   Elevated antinuclear antibody (ANA) level 10/06/2022   Nonspecific abnormal results of liver function study 10/06/2022   Other fatigue 09/21/2022   Psoriatic arthritis (HCC) 09/21/2022   Acute appendicitis 03/01/2018    No past medical history on file.  Family History  Problem Relation Age of Onset   Diabetes Father    High Cholesterol Father    High blood pressure Father    High blood pressure Brother    High Cholesterol Brother    Past Surgical History:  Procedure Laterality Date   LAPAROSCOPIC APPENDECTOMY N/A 03/01/2018   Procedure: APPENDECTOMY LAPAROSCOPIC;  Surgeon: Kinsinger, De Blanch, MD;  Location: [facility] OR;  Service: General;  Laterality: N/A;   Social History   Social History Narrative   Not on file    There is no immunization history on file for this patient.   Objective: Vital Signs: There were no vitals taken for  this visit.   Physical Exam   Musculoskeletal Exam: ***  CDAI Exam: CDAI Score: -- Patient Global: --; Provider Global: -- Swollen: --; Tender: -- Joint Exam 03/30/2023   No joint exam has been documented for this visit   There is currently no information documented on the homunculus. Go to the Rheumatology activity and complete the homunculus joint exam.  Investigation: No additional findings.  Imaging: No results found.  Recent Labs: Lab Results  Component Value Date   WBC 13.7 (H) 09/04/2022   HGB 16.4 09/04/2022   PLT 369 09/04/2022   NA 138 11/19/2022   K 4.3 11/19/2022   CL 101 11/19/2022   CO2 24 11/19/2022   GLUCOSE 90 11/19/2022   BUN 11 11/19/2022   CREATININE 0.94 11/19/2022   BILITOT 0.5 01/02/2023   ALKPHOS 72 11/19/2022   AST 219 (H) 01/02/2023   ALT 380 (H) 01/02/2023   PROT 8.4 (H) 01/02/2023   ALBUMIN 4.1 11/19/2022   CALCIUM 9.6 11/19/2022   GFRAA >60 03/01/2018   QFTBGOLDPLUS NEGATIVE 01/29/2023    Speciality Comments: No specialty comments available.  Procedures:  No procedures performed Allergies: Patient has no known allergies.   Assessment / Plan:     Visit Diagnoses: No diagnosis found.  ***  Orders: No orders of the defined types were placed in this encounter.  No orders of the defined types were placed in this encounter.    Follow-Up Instructions: No follow-ups on file.   Fuller Plan, MD  Note - This record has been created using [employer].  Chart creation errors have been sought, but may not always  have been located. Such creation errors do not reflect on  the standard of medical care.

## 2023-03-30 ENCOUNTER — Ambulatory Visit: Payer: Self-pay | Admitting: Internal Medicine

## 2023-03-30 NOTE — Telephone Encounter (Signed)
Patient no-show to appointment today with Dr. Dimple Casey. Will close encounter.  Chesley Mires, PharmD, MPH, BCPS, CPP Clinical Pharmacist (Rheumatology and Pulmonology)

## 2023-10-19 NOTE — Progress Notes (Unsigned)
Office Visit Note  Patient: Christopher Stokes             Date of Birth: 22-Oct-1986           MRN: [id number]             PCP: Carlean Jews, NP Referring: Carlean Jews, NP Visit Date: 10/20/2023   Subjective:  Pain and Edema of the Right Hand (1st, 2nd and 3rd digits swollen x 2 weeks) and Follow-up   History of Present Illness: Christopher Stokes is a 37 y.o. male here for follow up for psoriatic arthritis.  After visit earlier this year the plan was to start him on Cosentyx injections due to multiple swollen joints and active skin disease.  He never followed up so did not start treatment for this.  He is back today due to symptom exacerbation.  He is having increased swelling especially in his right hand worse for the past few weeks.  He does not recall an immediately preceding event he was sick with some type of viral URI symptoms about 2 months ago but these resolved uncomplicated.  Previous HPI 01/29/23 Christopher Stokes is a 37 y.o. male here for follow up for psoriatic arthritis workup at initial visit demonstrated markedly elevated liver function test.  He has not yet scheduled for the recommend right upper quadrant ultrasound to evaluate this.  Within the past weeks had progressive worsening of swelling in both knees pain with weightbearing continues to have swelling throughout multiple fingers of both hands.  He is taking the Celebrex consistently which is partially helpful.  No noticeable change in skin rashes.   Previous HPI 01/02/23 Christopher Stokes is a 37 y.o. male here for evaluation of pain and swelling affecting both hands and feet and abnormal labs with positive ANA.  Symptom onset was nonspecific for sought medical attention for this in October 2022 with joint pain swelling decreased range of motion most severely affected site at his right third finger initially.  He has had a couple episodic flareups of this over time which have been treated with oral prednisone with near  complete resolution of symptoms while on the steroids and tends to stay improved for at least a few weeks before redeveloping symptoms.  He does have persistent symptoms in between flareups.  Symptoms are most severe with prolonged morning stiffness his flexibility increases somewhat over the course of the day.  Hands and feet are both affected but hands worst he is also been noticing a bit more daily symptoms with the colder temperature this winter.  He is also been prescribed Celebrex has tried using over-the-counter NSAIDs but does not notice of a large difference in symptoms. He is also noticed skin rash and redness ongoing for at least since before he is joint swelling symptoms began but with insidious or nonspecific onset.  Has rashes on his scalp in the bellybutton notices some on the skin creases in his groin.  He tried using an over-the-counter medicated shampoo but did not see a large difference in symptoms.  While taking the prednisone tapers he thinks the rashes were less itchy but never really resolved.  He has never seen dermatology for this problem. He denies any new oral or nasal ulcers, lymphadenopathy, Raynaud's symptoms, or skin photosensitivity.  No history of abnormal bleeding or blood clots.   Labs reviewed 08/2022 ANA pos RNP 1.2 dsDNA, Sm, Scl-70, SSA, SSB, chromatin, Jo-1, centromere neg RF neg ESR 19 CRP 2  AST 112 ALT 289   08/2021 ANA neg RF neg CCP neg ESR 31 CRP 14 Uric acid 5.0   Review of Systems  Constitutional:  Negative for fatigue.  HENT:  Negative for mouth sores and mouth dryness.   Eyes:  Negative for dryness.  Respiratory:  Negative for shortness of breath.   Cardiovascular:  Negative for chest pain and palpitations.  Gastrointestinal:  Negative for blood in stool, constipation and diarrhea.  Endocrine: Negative for increased urination.  Genitourinary:  Negative for involuntary urination.  Musculoskeletal:  Positive for joint pain, joint pain,  joint swelling, myalgias, morning stiffness and myalgias. Negative for gait problem, muscle weakness and muscle tenderness.  Skin:  Positive for rash and redness. Negative for color change, hair loss and sensitivity to sunlight.  Allergic/Immunologic: Negative for susceptible to infections.  Neurological:  Negative for dizziness and headaches.  Hematological:  Negative for swollen glands.  Psychiatric/Behavioral:  Negative for depressed mood and sleep disturbance. The patient is not nervous/anxious.     PMFS History:  Patient Active Problem List   Diagnosis Date Noted   High risk medication use 01/29/2023   Rash and other nonspecific skin eruption 01/02/2023   Elevated antinuclear antibody (ANA) level 10/06/2022   Nonspecific abnormal results of liver function study 10/06/2022   Other fatigue 09/21/2022   Psoriatic arthritis (HCC) 09/21/2022   Acute appendicitis 03/01/2018    History reviewed. No pertinent past medical history.  Family History  Problem Relation Age of Onset   Diabetes Father    High Cholesterol Father    High blood pressure Father    High blood pressure Brother    High Cholesterol Brother    Past Surgical History:  Procedure Laterality Date   LAPAROSCOPIC APPENDECTOMY N/A 03/01/2018   Procedure: APPENDECTOMY LAPAROSCOPIC;  Surgeon: Kinsinger, De Blanch, MD;  Location: [facility] OR;  Service: General;  Laterality: N/A;   Social History   Social History Narrative   Not on file    There is no immunization history on file for this patient.   Objective: Vital Signs: BP 134/83 (BP Location: Left Arm, Patient Position: Sitting, Cuff Size: Normal)   Pulse (!) 102   Resp 14   Ht 5\' 6"  (1.676 m)   Wt 205 lb (93 kg)   BMI 33.09 kg/m    Physical Exam Eyes:     Conjunctiva/sclera: Conjunctivae normal.  Cardiovascular:     Rate and Rhythm: Normal rate and regular rhythm.  Pulmonary:     Effort: Pulmonary effort is normal.     Breath sounds: Normal breath sounds.   Lymphadenopathy:     Cervical: No cervical adenopathy.  Skin:    General: Skin is warm and dry.     Findings: Rash present.     Comments: Erythematous rash across the front hairline  Neurological:     Mental Status: He is alert.  Psychiatric:        Mood and Affect: Mood normal.      Musculoskeletal Exam:  Shoulders full ROM no tenderness or swelling Elbows full ROM no tenderness or swelling Wrists full ROM no tenderness or swelling Fingers synovitis throughout multiple joints, worst right 2nd-3rd fingers PIPs Small knee effusions with mild tenderness to pressure decreased flexion range of motion Left ankle swelling  Investigation: No additional findings.  Imaging: No results found.  Recent Labs: Lab Results  Component Value Date   WBC 13.7 (H) 09/04/2022   HGB 16.4 09/04/2022   PLT 369 09/04/2022   NA  138 11/19/2022   K 4.3 11/19/2022   CL 101 11/19/2022   CO2 24 11/19/2022   GLUCOSE 90 11/19/2022   BUN 11 11/19/2022   CREATININE 0.94 11/19/2022   BILITOT 0.5 01/02/2023   ALKPHOS 72 11/19/2022   AST 219 (H) 01/02/2023   ALT 380 (H) 01/02/2023   PROT 8.4 (H) 01/02/2023   ALBUMIN 4.1 11/19/2022   CALCIUM 9.6 11/19/2022   GFRAA >60 03/01/2018   QFTBGOLDPLUS NEGATIVE 01/29/2023    Speciality Comments: No specialty comments available.  Procedures:  No procedures performed Allergies: Patient has no known allergies.   Assessment / Plan:     Visit Diagnoses: Psoriatic arthritis (HCC) - Plan: methylPREDNISolone (MEDROL) 4 MG tablet  Plan is to start on Cosentyx injections as previously discussed but we will need to repeat his baseline labs as it has been several months past normal monitoring.  The previous upward trend in AST and ALT with unclear etiology will prescribe methylprednisolone for acute inflammation 12-day taper.  If numbers are much worse may need more workup prior to Cosentyx but do not anticipate extra delay..  Nonspecific abnormal results of  liver function study  Would benefit from further workup if LFTs remaining 10 times upper limit of normal or worse.  High risk medication use - Plan: CBC with Differential/Platelet, COMPLETE METABOLIC PANEL WITH GFR  Checking CBC and CMP for medication monitoring anticipating new Cosentyx start.  Reviewed risk medication including injection reactions, infections, cytopenias, hepatotoxicity.  Orders: Orders Placed This Encounter  Procedures   CBC with Differential/Platelet   COMPLETE METABOLIC PANEL WITH GFR   Meds ordered this encounter  Medications   methylPREDNISolone (MEDROL) 4 MG tablet    Sig: Take 4 tablets (16 mg total) by mouth daily for 3 days, THEN 3 tablets (12 mg total) daily for 3 days, THEN 2 tablets (8 mg total) daily for 3 days, THEN 1 tablet (4 mg total) daily for 3 days.    Dispense:  30 tablet    Refill:  0     Follow-Up Instructions: Return in about 2 months (around 12/20/2023) for PsA COS start f/u 2mos.   Fuller Plan, MD  Note - This record has been created using [employer].  Chart creation errors have been sought, but may not always  have been located. Such creation errors do not reflect on  the standard of medical care.

## 2023-10-20 ENCOUNTER — Encounter: Payer: Self-pay | Admitting: Internal Medicine

## 2023-10-20 ENCOUNTER — Ambulatory Visit: Payer: Self-pay | Attending: Internal Medicine | Admitting: Internal Medicine

## 2023-10-20 VITALS — BP 134/83 | HR 102 | Resp 14 | Ht 66.0 in | Wt 205.0 lb

## 2023-10-20 DIAGNOSIS — R945 Abnormal results of liver function studies: Secondary | ICD-10-CM

## 2023-10-20 DIAGNOSIS — Z79899 Other long term (current) drug therapy: Secondary | ICD-10-CM

## 2023-10-20 DIAGNOSIS — L4050 Arthropathic psoriasis, unspecified: Secondary | ICD-10-CM

## 2023-10-20 MED ORDER — METHYLPREDNISOLONE 4 MG PO TABS: ORAL_TABLET | ORAL | 0 refills | Status: AC

## 2023-10-20 NOTE — Progress Notes (Unsigned)
Pharmacy Note  Subjective:   Patient presents to clinic today to receive first dose of Cosentyx for PsA/PsO.  He is naive to treatment but unable to take MTX or leflunomide due to baseline elevated baseline LFTs (possible fatty liver disease). He also is uninsured  Patient running a fever or have signs/symptoms of infection? {yes/no:20286}  Patient currently on antibiotics for the treatment of infection? {yes/no:20286}  Patient have any upcoming invasive procedures/surgeries? {yes/no:20286}  Objective: CMP     Component Value Date/Time   NA 138 11/19/2022 1325   K 4.3 11/19/2022 1325   CL 101 11/19/2022 1325   CO2 24 11/19/2022 1325   GLUCOSE 90 11/19/2022 1325   GLUCOSE 124 (H) 03/01/2018 1231   BUN 11 11/19/2022 1325   CREATININE 0.94 11/19/2022 1325   CALCIUM 9.6 11/19/2022 1325   PROT 8.4 (H) 01/02/2023 0903   PROT 7.5 11/19/2022 1325   ALBUMIN 4.1 11/19/2022 1325   AST 219 (H) 01/02/2023 0903   ALT 380 (H) 01/02/2023 0903   ALKPHOS 72 11/19/2022 1325   BILITOT 0.5 01/02/2023 0903   BILITOT 0.8 11/19/2022 1325   GFRNONAA >60 03/01/2018 1231   GFRAA >60 03/01/2018 1231    CBC    Component Value Date/Time   WBC 13.7 (H) 09/04/2022 1028   WBC 21.2 (H) 03/01/2018 1231   RBC 5.66 09/04/2022 1028   RBC 5.77 03/01/2018 1231   HGB 16.4 09/04/2022 1028   HCT 48.6 09/04/2022 1028   PLT 369 09/04/2022 1028   MCV 86 09/04/2022 1028   MCH 29.0 09/04/2022 1028   MCH 28.9 03/01/2018 1231   MCHC 33.7 09/04/2022 1028   MCHC 33.4 03/01/2018 1231   RDW 12.6 09/04/2022 1028   LYMPHSABS 3.1 09/17/2021 0954   EOSABS 0.2 09/17/2021 0954   BASOSABS 0.1 09/17/2021 0954    Baseline Immunosuppressant Therapy Labs TB GOLD    Latest Ref Rng & Units 01/29/2023    8:21 AM  Quantiferon TB Gold  Quantiferon TB Gold Plus NEGATIVE NEGATIVE    Hepatitis Panel    Latest Ref Rng & Units 01/02/2023    9:03 AM  Hepatitis  Hep B Surface Ag NON-REACTIVE NON-REACTIVE   Hep C Ab  NON-REACTIVE NON-REACTIVE    HIV No results found for: "HIV" Immunoglobulins   SPEP    Latest Ref Rng & Units 01/02/2023    9:03 AM  Serum Protein Electrophoresis  Total Protein 6.1 - 8.1 g/dL 8.4    Assessment/Plan:  Reviewed importance of holding COSENTYX with signs/symptoms of an infections, if antibiotics are prescribed to treat an active infection, and with invasive procedures  Demonstrated proper injection technique with COSENTYX demo device  Patient able to demonstrate proper injection technique using the teach back method.  Patient self injected in the {injsitedsg:28167} with:  Pharmacy-supplied Medication: Cosentyx Unoready 300mg /44mL pen injector NDC: *** Lot: *** Expiration: ***  Patient tolerated well.  Observed for 30 mins in office for adverse reaction. {injectionreaction:30756}  Patient is to return in 1 month for labs and 6-8 weeks for follow-up appointment.  Standing orders for CBC/CMP placed.  TB gold will be monitored yearly.   COSENTYX SQ approved through patient assistance .   Rx sent to: [facility] for Cos[facility]x: [phone].  Patient provided with pharmacy phone number and advised to call later this week to schedule shipment to home.  Patient will continue Cosentyx 300 mg subcut at Weeks 0 (administered in clinic today), 1, 2, 3, 4 then 300 mg subcut every 28  days.    All questions encouraged and answered.  Instructed patient to call with any further questions or concerns.  Chesley Mires, PharmD, MPH, BCPS, CPP Clinical Pharmacist (Rheumatology and Pulmonology)  10/20/2023 11:39 AM

## 2023-10-20 NOTE — Patient Instructions (Signed)
Your next COSENTYX SQ dose is due on 11/02/23, 11/09/23, 11/16/23, 11/23/23, then every 4 weeks thereafter (starting 12/21/2023)  HOLD COSENTYX SQ if you have signs or symptoms of an infection. You can resume once you feel better or back to your baseline. HOLD COSENTYX SQ if you start antibiotics to treat an infection. HOLD COSENTYX SQ around the time of surgery/procedures. Your surgeon will be able to provide recommendations on when to hold BEFORE and when you are cleared to RESUME.  Pharmacy information: Your prescription will be shipped from [facility]. Their phone number is [phone] Please call to schedule shipment and confirm address. They will mail your medication to your home.  Cost information: Cosentyx is approved for you to receive for free through March 2025. Will need to reapply about 1-2 months beforehand  Labs are due in 1 month then every 3 months. Lab hours are from Monday to Thursday 8am-12:30pm and 1pm-5pm and Friday 8am-12pm. You do not need an appointment if you come for labs during these times. If you'd like to go to a [facility] or [facility] closer to home, please call our clinic 48 hours prior to lab date so we can release orders in a timely manner.  How to manage an injection site reaction: Remember the 5 C's: COUNTER - leave on the counter at least 30 minutes but up to overnight to bring medication to room temperature. This may help prevent stinging COLD - place something cold (like an ice gel pack or cold water bottle) on the injection site just before cleansing with alcohol. This may help reduce pain CLARITIN - use Claritin (generic name is loratadine) for the first two weeks of treatment or the day of, the day before, and the day after injecting. This will help to minimize injection site reactions CORTISONE CREAM - apply if injection site is irritated and itching CALL ME - if injection site reaction is bigger than the size of your fist, looks infected, blisters,  or if you develop hives

## 2023-10-21 LAB — CBC WITH DIFFERENTIAL/PLATELET
Absolute Lymphocytes: 2639 {cells}/uL (ref 850–3900)
Absolute Monocytes: 847 {cells}/uL (ref 200–950)
Basophils Absolute: 50 {cells}/uL (ref 0–200)
Basophils Relative: 0.6 %
Eosinophils Absolute: 531 {cells}/uL — ABNORMAL HIGH (ref 15–500)
Eosinophils Relative: 6.4 %
HCT: 47.9 % (ref 38.5–50.0)
Hemoglobin: 16 g/dL (ref 13.2–17.1)
MCH: 29.4 pg (ref 27.0–33.0)
MCHC: 33.4 g/dL (ref 32.0–36.0)
MCV: 88.1 fL (ref 80.0–100.0)
MPV: 9.9 fL (ref 7.5–12.5)
Monocytes Relative: 10.2 %
Neutro Abs: 4233 {cells}/uL (ref 1500–7800)
Neutrophils Relative %: 51 %
Platelets: 331 10*3/uL (ref 140–400)
RBC: 5.44 10*6/uL (ref 4.20–5.80)
RDW: 12.5 % (ref 11.0–15.0)
Total Lymphocyte: 31.8 %
WBC: 8.3 10*3/uL (ref 3.8–10.8)

## 2023-10-21 LAB — COMPLETE METABOLIC PANEL WITH GFR
AG Ratio: 1.1 (calc) (ref 1.0–2.5)
ALT: 261 U/L — ABNORMAL HIGH (ref 9–46)
AST: 99 U/L — ABNORMAL HIGH (ref 10–40)
Albumin: 4 g/dL (ref 3.6–5.1)
Alkaline phosphatase (APISO): 84 U/L (ref 36–130)
BUN: 18 mg/dL (ref 7–25)
CO2: 26 mmol/L (ref 20–32)
Calcium: 9.2 mg/dL (ref 8.6–10.3)
Chloride: 104 mmol/L (ref 98–110)
Creat: 1.04 mg/dL (ref 0.60–1.26)
Globulin: 3.8 g/dL — ABNORMAL HIGH (ref 1.9–3.7)
Glucose, Bld: 117 mg/dL — ABNORMAL HIGH (ref 65–99)
Potassium: 3.8 mmol/L (ref 3.5–5.3)
Sodium: 140 mmol/L (ref 135–146)
Total Bilirubin: 0.3 mg/dL (ref 0.2–1.2)
Total Protein: 7.8 g/dL (ref 6.1–8.1)
eGFR: 97 mL/min/{1.73_m2} (ref >=60)

## 2023-10-21 NOTE — Progress Notes (Signed)
Blood count looks okay. Eosinophils are slightly high at 531 these are most often associated with allergies but nonspecific. Liver enzymes are abnormal but better than 9 months ago. AST down from 219 to 99 and ALT down from 380 to 261.

## 2023-10-26 ENCOUNTER — Ambulatory Visit: Payer: Self-pay | Attending: Internal Medicine | Admitting: Pharmacist

## 2023-10-26 DIAGNOSIS — L4050 Arthropathic psoriasis, unspecified: Secondary | ICD-10-CM

## 2023-10-26 DIAGNOSIS — M1990 Unspecified osteoarthritis, unspecified site: Secondary | ICD-10-CM

## 2023-10-26 DIAGNOSIS — Z79899 Other long term (current) drug therapy: Secondary | ICD-10-CM

## 2023-10-26 DIAGNOSIS — Z7189 Other specified counseling: Secondary | ICD-10-CM

## 2023-10-26 MED ORDER — COSENTYX UNOREADY 300 MG/2ML ~~LOC~~ SOAJ: 300.0000 mg | SUBCUTANEOUS | 0 refills | Status: DC

## 2023-10-26 MED ORDER — COSENTYX UNOREADY 300 MG/2ML ~~LOC~~ SOAJ: 300.0000 mg | SUBCUTANEOUS | Status: DC

## 2023-12-08 NOTE — Progress Notes (Signed)
Office Visit Note  Patient: Christopher Stokes             Date of Birth: 1986/07/29           MRN: [id number]             PCP: Carlean Jews, NP Referring: Carlean Jews, NP Visit Date: 12/22/2023   Subjective:  Follow-up   Discussed the use of AI scribe software for clinical note transcription with the patient, who gave verbal consent to proceed.  History of Present Illness   Christopher Stokes is a 38 y.o. male here for follow up for psoriatic arthritis.  He reports significant improvement over the past two months since starting Cosentyx. They note the absence of pain and reduced morning aches, although they mention a recent mild resurgence of discomfort. The patient also reports a noticeable improvement in skin rashes, which are now less severe.  The patient experiences itchiness around the shoulders for a couple of hours following the Cosentyx injections. However, they have discontinued over-the-counter pain medications such as ibuprofen, Aleve, and Tylenol since starting Cosentyx, indicating a significant reduction in pain.  The patient also reports an episode of a cold-like illness about a month ago, which resolved without antibiotics. They have been able to continue working without difficulty and have noticed an improvement in their grip strength. The patient acknowledges that the left hand still shows more signs of disease than the right, particularly in the fifth finger joint and other finger joints. Despite this, the overall trajectory of their condition appears to be improving.   Previous HPI 10/20/2023 Christopher Stokes is a 38 y.o. male here for follow up for psoriatic arthritis.  After visit earlier this year the plan was to start him on Cosentyx injections due to multiple swollen joints and active skin disease.  He never followed up so did not start treatment for this.  He is back today due to symptom exacerbation.  He is having increased swelling especially in his right hand  worse for the past few weeks.  He does not recall an immediately preceding event he was sick with some type of viral URI symptoms about 2 months ago but these resolved uncomplicated.   Previous HPI 01/29/23 Christopher Stokes is a 38 y.o. male here for follow up for psoriatic arthritis workup at initial visit demonstrated markedly elevated liver function test.  He has not yet scheduled for the recommend right upper quadrant ultrasound to evaluate this.  Within the past weeks had progressive worsening of swelling in both knees pain with weightbearing continues to have swelling throughout multiple fingers of both hands.  He is taking the Celebrex consistently which is partially helpful.  No noticeable change in skin rashes.   Previous HPI 01/02/23 Christopher Stokes is a 38 y.o. male here for evaluation of pain and swelling affecting both hands and feet and abnormal labs with positive ANA.  Symptom onset was nonspecific for sought medical attention for this in October 2022 with joint pain swelling decreased range of motion most severely affected site at his right third finger initially.  He has had a couple episodic flareups of this over time which have been treated with oral prednisone with near complete resolution of symptoms while on the steroids and tends to stay improved for at least a few weeks before redeveloping symptoms.  He does have persistent symptoms in between flareups.  Symptoms are most severe with prolonged morning stiffness his flexibility increases somewhat  over the course of the day.  Hands and feet are both affected but hands worst he is also been noticing a bit more daily symptoms with the colder temperature this winter.  He is also been prescribed Celebrex has tried using over-the-counter NSAIDs but does not notice of a large difference in symptoms. He is also noticed skin rash and redness ongoing for at least since before he is joint swelling symptoms began but with insidious or nonspecific  onset.  Has rashes on his scalp in the bellybutton notices some on the skin creases in his groin.  He tried using an over-the-counter medicated shampoo but did not see a large difference in symptoms.  While taking the prednisone tapers he thinks the rashes were less itchy but never really resolved.  He has never seen dermatology for this problem. He denies any new oral or nasal ulcers, lymphadenopathy, Raynaud's symptoms, or skin photosensitivity.  No history of abnormal bleeding or blood clots.   Labs reviewed 08/2022 ANA pos RNP 1.2 dsDNA, Sm, Scl-70, SSA, SSB, chromatin, Jo-1, centromere neg RF neg ESR 19 CRP 2 AST 112 ALT 289   08/2021 ANA neg RF neg CCP neg ESR 31 CRP 14 Uric acid 5.0   Review of Systems  Constitutional:  Negative for fatigue.  HENT:  Negative for mouth sores and mouth dryness.   Eyes:  Negative for dryness.  Respiratory:  Negative for shortness of breath.   Cardiovascular:  Negative for chest pain and palpitations.  Gastrointestinal:  Negative for blood in stool, constipation and diarrhea.  Endocrine: Negative for increased urination.  Genitourinary:  Negative for involuntary urination.  Musculoskeletal:  Positive for joint pain, joint pain and morning stiffness. Negative for gait problem, joint swelling, myalgias, muscle weakness, muscle tenderness and myalgias.  Skin:  Negative for color change, rash, hair loss and sensitivity to sunlight.  Allergic/Immunologic: Negative for susceptible to infections.  Neurological:  Negative for dizziness and headaches.  Hematological:  Negative for swollen glands.  Psychiatric/Behavioral:  Negative for depressed mood and sleep disturbance. The patient is not nervous/anxious.     PMFS History:  Patient Active Problem List   Diagnosis Date Noted   High risk medication use 01/29/2023   Rash and other nonspecific skin eruption 01/02/2023   Elevated antinuclear antibody (ANA) level 10/06/2022   Nonspecific abnormal  results of liver function study 10/06/2022   Other fatigue 09/21/2022   Psoriatic arthritis (HCC) 09/21/2022   Acute appendicitis 03/01/2018    History reviewed. No pertinent past medical history.  Family History  Problem Relation Age of Onset   Diabetes Father    High Cholesterol Father    High blood pressure Father    High blood pressure Brother    High Cholesterol Brother    Past Surgical History:  Procedure Laterality Date   LAPAROSCOPIC APPENDECTOMY N/A 03/01/2018   Procedure: APPENDECTOMY LAPAROSCOPIC;  Surgeon: Kinsinger, De Blanch, MD;  Location: [facility] OR;  Service: General;  Laterality: N/A;   Social History   Social History Narrative   Not on file    There is no immunization history on file for this patient.   Objective: Vital Signs: BP 131/87 (BP Location: Left Arm, Patient Position: Sitting, Cuff Size: Large)   Pulse 73   Resp 14   Ht 5\' 6"  (1.676 m)   Wt 209 lb (94.8 kg)   BMI 33.73 kg/m    Physical Exam Eyes:     Conjunctiva/sclera: Conjunctivae normal.  Cardiovascular:     Rate and  Rhythm: Normal rate and regular rhythm.  Pulmonary:     Effort: Pulmonary effort is normal.     Breath sounds: Normal breath sounds.  Lymphadenopathy:     Cervical: No cervical adenopathy.  Skin:    General: Skin is warm and dry.     Findings: Rash present.     Comments: Flat, faint erythema along front of hairline and behind ears  Neurological:     Mental Status: He is alert.  Psychiatric:        Mood and Affect: Mood normal.      Musculoskeletal Exam:  Shoulders full ROM no tenderness or swelling Elbows full ROM no tenderness or swelling Wrists full ROM no tenderness or swelling Right 2nd-3rd and 5th PIP joint swelling, slightly restricted flexion ROM, no tenderness to pressure Knees full ROM no tenderness or swelling Ankles full ROM no tenderness or swelling   Investigation: No additional findings.  Imaging: No results found.  Recent Labs: Lab Results   Component Value Date   WBC 8.3 10/20/2023   HGB 16.0 10/20/2023   PLT 331 10/20/2023   NA 140 10/20/2023   K 3.8 10/20/2023   CL 104 10/20/2023   CO2 26 10/20/2023   GLUCOSE 117 (H) 10/20/2023   BUN 18 10/20/2023   CREATININE 1.04 10/20/2023   BILITOT 0.3 10/20/2023   ALKPHOS 72 11/19/2022   AST 99 (H) 10/20/2023   ALT 261 (H) 10/20/2023   PROT 7.8 10/20/2023   ALBUMIN 4.1 11/19/2022   CALCIUM 9.2 10/20/2023   GFRAA >60 03/01/2018   QFTBGOLDPLUS NEGATIVE 01/29/2023    Speciality Comments: Cosentyx started 10/26/23  Procedures:  No procedures performed Allergies: Patient has no known allergies.   Assessment / Plan:     Visit Diagnoses: Psoriatic arthritis (HCC) - Plan: Sedimentation rate Improvement in joint pain and swelling with Cosentyx. Mild residual swelling in left hand. No significant side effects reported, only transient itchiness post-injection.  No longer requiring regular use of oral NSAIDs for symptom control. -Continue Cosentyx 300 mg subcu monthly -Checking sed rate for disease activity monitoring  Psoriasis Significant improvement in skin rash with Cosentyx.  Abnormal liver function tests High risk medication use - Plan is to start on Cosentyx injections - Plan: CBC with Differential/Platelet, COMPLETE METABOLIC PANEL WITH GFR Appears to be tolerating injections fine.  Describes slight itching after each dose but no visible skin change.  No serious interval infections. -Checking CBC and CMP medication monitoring after new start of Cosentyx  General Health Maintenance -Review blood test results to ensure no adverse effects from Cosentyx. -Follow-up appointment to monitor progress and assess blood test results.    Orders: Orders Placed This Encounter  Procedures   Sedimentation rate   CBC with Differential/Platelet   COMPLETE METABOLIC PANEL WITH GFR   No orders of the defined types were placed in this encounter.    Follow-Up Instructions:  Return in about 3 months (around 03/21/2024) for PsA on COS f/u 3-10mos.   Fuller Plan, MD  Note - This record has been created using [employer].  Chart creation errors have been sought, but may not always  have been located. Such creation errors do not reflect on  the standard of medical care.

## 2023-12-22 ENCOUNTER — Ambulatory Visit: Payer: Self-pay | Attending: Internal Medicine | Admitting: Internal Medicine

## 2023-12-22 ENCOUNTER — Other Ambulatory Visit: Payer: Self-pay | Admitting: Internal Medicine

## 2023-12-22 ENCOUNTER — Encounter: Payer: Self-pay | Admitting: Internal Medicine

## 2023-12-22 VITALS — BP 131/87 | HR 73 | Resp 14 | Ht 66.0 in | Wt 209.0 lb

## 2023-12-22 DIAGNOSIS — L4050 Arthropathic psoriasis, unspecified: Secondary | ICD-10-CM

## 2023-12-22 DIAGNOSIS — Z79899 Other long term (current) drug therapy: Secondary | ICD-10-CM

## 2023-12-22 DIAGNOSIS — R945 Abnormal results of liver function studies: Secondary | ICD-10-CM

## 2023-12-23 LAB — COMPLETE METABOLIC PANEL WITH GFR
AG Ratio: 1.2 (calc) (ref 1.0–2.5)
ALT: 365 U/L — ABNORMAL HIGH (ref 9–46)
AST: 246 U/L — ABNORMAL HIGH (ref 10–40)
Albumin: 4.2 g/dL (ref 3.6–5.1)
Alkaline phosphatase (APISO): 80 U/L (ref 36–130)
BUN: 12 mg/dL (ref 7–25)
CO2: 29 mmol/L (ref 20–32)
Calcium: 9.7 mg/dL (ref 8.6–10.3)
Chloride: 99 mmol/L (ref 98–110)
Creat: 0.92 mg/dL (ref 0.60–1.26)
Globulin: 3.4 g/dL (ref 1.9–3.7)
Glucose, Bld: 105 mg/dL — ABNORMAL HIGH (ref 65–99)
Potassium: 4 mmol/L (ref 3.5–5.3)
Sodium: 137 mmol/L (ref 135–146)
Total Bilirubin: 1.5 mg/dL — ABNORMAL HIGH (ref 0.2–1.2)
Total Protein: 7.6 g/dL (ref 6.1–8.1)
eGFR: 111 mL/min/{1.73_m2} (ref >=60)

## 2023-12-23 LAB — CBC WITH DIFFERENTIAL/PLATELET
Absolute Lymphocytes: 2742 {cells}/uL (ref 850–3900)
Absolute Monocytes: 800 {cells}/uL (ref 200–950)
Basophils Absolute: 74 {cells}/uL (ref 0–200)
Basophils Relative: 0.8 %
Eosinophils Absolute: 432 {cells}/uL (ref 15–500)
Eosinophils Relative: 4.7 %
HCT: 49.7 % (ref 38.5–50.0)
Hemoglobin: 17 g/dL (ref 13.2–17.1)
MCH: 29.9 pg (ref 27.0–33.0)
MCHC: 34.2 g/dL (ref 32.0–36.0)
MCV: 87.3 fL (ref 80.0–100.0)
MPV: 10 fL (ref 7.5–12.5)
Monocytes Relative: 8.7 %
Neutro Abs: 5152 {cells}/uL (ref 1500–7800)
Neutrophils Relative %: 56 %
Platelets: 253 10*3/uL (ref 140–400)
RBC: 5.69 10*6/uL (ref 4.20–5.80)
RDW: 12.7 % (ref 11.0–15.0)
Total Lymphocyte: 29.8 %
WBC: 9.2 10*3/uL (ref 3.8–10.8)

## 2023-12-23 LAB — SEDIMENTATION RATE: Sed Rate: 6 mm/h (ref 0–15)

## 2024-01-18 ENCOUNTER — Telehealth: Payer: Self-pay | Admitting: Pharmacist

## 2024-01-18 NOTE — Telephone Encounter (Signed)
Patient's Cosentyx SQ PAP enrollment expires on 02/24/2024. Patient appears to continue to be uninsured.  ATC patient - unable to reach and unable to leave VM. Patietn forms mailed to home.

## 2024-01-20 NOTE — Telephone Encounter (Signed)
Received signed provider form from Dr. Dimple Casey.  Chesley Mires, PharmD, MPH, BCPS, CPP Clinical Pharmacist (Rheumatology and Pulmonology)

## 2024-03-08 NOTE — Progress Notes (Deleted)
 Office Visit Note  Patient: Christopher Stokes             Date of Birth: [birth year]           MRN: [id number]             PCP: Carlean Jews, NP Referring: Carlean Jews, NP Visit Date: 03/21/2024   Subjective:  No chief complaint on file.   History of Present Illness: Christopher Stokes is a 38 y.o. male here for follow up for psoriatic arthritis.    Previous HPI 12/22/2023 Christopher Stokes is a 38 y.o. male here for follow up for psoriatic arthritis.  He reports significant improvement over the past two months since starting Cosentyx. They note the absence of pain and reduced morning aches, although they mention a recent mild resurgence of discomfort. The patient also reports a noticeable improvement in skin rashes, which are now less severe.   The patient experiences itchiness around the shoulders for a couple of hours following the Cosentyx injections. However, they have discontinued over-the-counter pain medications such as ibuprofen, Aleve, and Tylenol since starting Cosentyx, indicating a significant reduction in pain.   The patient also reports an episode of a cold-like illness about a month ago, which resolved without antibiotics. They have been able to continue working without difficulty and have noticed an improvement in their grip strength. The patient acknowledges that the left hand still shows more signs of disease than the right, particularly in the fifth finger joint and other finger joints. Despite this, the overall trajectory of their condition appears to be improving.    Previous HPI 10/20/2023 Christopher Stokes is a 38 y.o. male here for follow up for psoriatic arthritis.  After visit earlier this year the plan was to start him on Cosentyx injections due to multiple swollen joints and active skin disease.  He never followed up so did not start treatment for this.  He is back today due to symptom exacerbation.  He is having increased swelling especially in his right hand  worse for the past few weeks.  He does not recall an immediately preceding event he was sick with some type of viral URI symptoms about 2 months ago but these resolved uncomplicated.   Previous HPI 01/29/23 Christopher Stokes is a 38 y.o. male here for follow up for psoriatic arthritis workup at initial visit demonstrated markedly elevated liver function test.  He has not yet scheduled for the recommend right upper quadrant ultrasound to evaluate this.  Within the past weeks had progressive worsening of swelling in both knees pain with weightbearing continues to have swelling throughout multiple fingers of both hands.  He is taking the Celebrex consistently which is partially helpful.  No noticeable change in skin rashes.   Previous HPI 01/02/23 Christopher Stokes is a 38 y.o. male here for evaluation of pain and swelling affecting both hands and feet and abnormal labs with positive ANA.  Symptom onset was nonspecific for sought medical attention for this in October 2022 with joint pain swelling decreased range of motion most severely affected site at his right third finger initially.  He has had a couple episodic flareups of this over time which have been treated with oral prednisone with near complete resolution of symptoms while on the steroids and tends to stay improved for at least a few weeks before redeveloping symptoms.  He does have persistent symptoms in between flareups.  Symptoms are most severe with prolonged morning  stiffness his flexibility increases somewhat over the course of the day.  Hands and feet are both affected but hands worst he is also been noticing a bit more daily symptoms with the colder temperature this winter.  He is also been prescribed Celebrex has tried using over-the-counter NSAIDs but does not notice of a large difference in symptoms. He is also noticed skin rash and redness ongoing for at least since before he is joint swelling symptoms began but with insidious or nonspecific  onset.  Has rashes on his scalp in the bellybutton notices some on the skin creases in his groin.  He tried using an over-the-counter medicated shampoo but did not see a large difference in symptoms.  While taking the prednisone tapers he thinks the rashes were less itchy but never really resolved.  He has never seen dermatology for this problem. He denies any new oral or nasal ulcers, lymphadenopathy, Raynaud's symptoms, or skin photosensitivity.  No history of abnormal bleeding or blood clots.   Labs reviewed 08/2022 ANA pos RNP 1.2 dsDNA, Sm, Scl-70, SSA, SSB, chromatin, Jo-1, centromere neg RF neg ESR 19 CRP 2 AST 112 ALT 289   08/2021 ANA neg RF neg CCP neg ESR 31 CRP 14 Uric acid 5.0   No Rheumatology ROS completed.   PMFS History:  Patient Active Problem List   Diagnosis Date Noted   High risk medication use 01/29/2023   Rash and other nonspecific skin eruption 01/02/2023   Elevated antinuclear antibody (ANA) level 10/06/2022   Nonspecific abnormal results of liver function study 10/06/2022   Other fatigue 09/21/2022   Psoriatic arthritis (HCC) 09/21/2022   Acute appendicitis 03/01/2018    No past medical history on file.  Family History  Problem Relation Age of Onset   Diabetes Father    High Cholesterol Father    High blood pressure Father    High blood pressure Brother    High Cholesterol Brother    Past Surgical History:  Procedure Laterality Date   LAPAROSCOPIC APPENDECTOMY N/A 03/01/2018   Procedure: APPENDECTOMY LAPAROSCOPIC;  Surgeon: Kinsinger, De Blanch, MD;  Location: [facility] OR;  Service: General;  Laterality: N/A;   Social History   Social History Narrative   Not on file    There is no immunization history on file for this patient.   Objective: Vital Signs: There were no vitals taken for this visit.   Physical Exam   Musculoskeletal Exam: ***  CDAI Exam: CDAI Score: -- Patient Global: --; Provider Global: -- Swollen: --; Tender:  -- Joint Exam 03/21/2024   No joint exam has been documented for this visit   There is currently no information documented on the homunculus. Go to the Rheumatology activity and complete the homunculus joint exam.  Investigation: No additional findings.  Imaging: No results found.  Recent Labs: Lab Results  Component Value Date   WBC 9.2 12/22/2023   HGB 17.0 12/22/2023   PLT 253 12/22/2023   NA 137 12/22/2023   K 4.0 12/22/2023   CL 99 12/22/2023   CO2 29 12/22/2023   GLUCOSE 105 (H) 12/22/2023   BUN 12 12/22/2023   CREATININE 0.92 12/22/2023   BILITOT 1.5 (H) 12/22/2023   ALKPHOS 72 11/19/2022   AST 246 (H) 12/22/2023   ALT 365 (H) 12/22/2023   PROT 7.6 12/22/2023   ALBUMIN 4.1 11/19/2022   CALCIUM 9.7 12/22/2023   GFRAA >60 03/01/2018   QFTBGOLDPLUS NEGATIVE 01/29/2023    Speciality Comments: Cosentyx started 10/26/23  Procedures:  No procedures performed Allergies: Patient has no known allergies.   Assessment / Plan:     Visit Diagnoses: No diagnosis found.  ***  Orders: No orders of the defined types were placed in this encounter.  No orders of the defined types were placed in this encounter.    Follow-Up Instructions: No follow-ups on file.   Metta Clines, RT  Note - This record has been created using [employer].  Chart creation errors have been sought, but may not always  have been located. Such creation errors do not reflect on  the standard of medical care.

## 2024-03-21 ENCOUNTER — Ambulatory Visit: Payer: Self-pay | Admitting: Internal Medicine

## 2024-03-21 DIAGNOSIS — L4050 Arthropathic psoriasis, unspecified: Secondary | ICD-10-CM

## 2024-03-21 DIAGNOSIS — Z79899 Other long term (current) drug therapy: Secondary | ICD-10-CM

## 2024-03-21 DIAGNOSIS — R7989 Other specified abnormal findings of blood chemistry: Secondary | ICD-10-CM

## 2024-03-21 DIAGNOSIS — L409 Psoriasis, unspecified: Secondary | ICD-10-CM

## 2024-03-22 NOTE — Telephone Encounter (Signed)
 Patient no show to appointment with Dr. Rodell Citrin on 03/21/2024. Closing follow-up on this encounter

## 2024-06-07 ENCOUNTER — Ambulatory Visit
Admission: RE | Admit: 2024-06-07 | Discharge: 2024-06-07 | Disposition: A | Discharge status: Other Acute Inpatient Hospital | Payer: Self-pay | Source: Ambulatory Visit | Attending: Family Medicine | Admitting: Family Medicine

## 2024-06-07 ENCOUNTER — Emergency Department (HOSPITAL_BASED_OUTPATIENT_CLINIC_OR_DEPARTMENT_OTHER)
Admission: EM | Admit: 2024-06-07 | Discharge: 2024-06-07 | Disposition: A | Discharge status: Home / Self Care | Payer: Self-pay | Source: Ambulatory Visit | Attending: Emergency Medicine | Admitting: Emergency Medicine

## 2024-06-07 ENCOUNTER — Other Ambulatory Visit: Payer: Self-pay

## 2024-06-07 ENCOUNTER — Encounter (HOSPITAL_BASED_OUTPATIENT_CLINIC_OR_DEPARTMENT_OTHER): Payer: Self-pay | Admitting: Emergency Medicine

## 2024-06-07 ENCOUNTER — Emergency Department (HOSPITAL_BASED_OUTPATIENT_CLINIC_OR_DEPARTMENT_OTHER): Payer: Self-pay

## 2024-06-07 ENCOUNTER — Emergency Department (HOSPITAL_BASED_OUTPATIENT_CLINIC_OR_DEPARTMENT_OTHER): Payer: Self-pay | Admitting: Radiology

## 2024-06-07 VITALS — BP 132/88 | HR 96 | Temp 98.6°F | Resp 16

## 2024-06-07 DIAGNOSIS — M7989 Other specified soft tissue disorders: Secondary | ICD-10-CM

## 2024-06-07 DIAGNOSIS — M7051 Other bursitis of knee, right knee: Secondary | ICD-10-CM

## 2024-06-07 DIAGNOSIS — Y93G3 Activity, cooking and baking: Secondary | ICD-10-CM | POA: Insufficient documentation

## 2024-06-07 DIAGNOSIS — M7041 Prepatellar bursitis, right knee: Secondary | ICD-10-CM | POA: Insufficient documentation

## 2024-06-07 HISTORY — DX: Arthropathic psoriasis, unspecified: L40.50

## 2024-06-07 MED ORDER — METHYLPREDNISOLONE 4 MG PO TBPK: ORAL_TABLET | ORAL | 0 refills | Status: DC

## 2024-06-07 MED ORDER — OXYCODONE HCL 5 MG PO TABS: 5.0000 mg | ORAL_TABLET | Freq: Four times a day (QID) | ORAL | 0 refills | Status: AC | PRN

## 2024-06-07 NOTE — Discharge Instructions (Addendum)
Please go to the emergency room for further evaluation of your leg swelling

## 2024-06-07 NOTE — ED Notes (Signed)
 Patient is being discharged from the Urgent Care and sent to the Emergency Department via POV . Per Myla Bold, NP, patient is in need of higher level of care due to needing further testing. Patient is aware and verbalizes understanding of plan of care.  Vitals:   06/07/24 1131  BP: 132/88  Pulse: 96  Resp: 16  Temp: 98.6 F (37 C)  SpO2: 97%

## 2024-06-07 NOTE — ED Notes (Signed)
Reviewed discharge instructions, medications, and home care with pt. Pt verbalized understanding and had no further questions. Pt exited ED without complications.

## 2024-06-07 NOTE — Discharge Instructions (Addendum)
 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  Use Ace wrap for compression.  Please avoid any vigorous activities.  Take steroids as prescribed.  Recommend ice 20 minutes at a time several times a day.  Follow-up with orthopedics.  Return if symptoms worsen.  I have prescribed you a narcotic pain medicine called Roxicodone  for breakthrough pain.  This medication is sedating so do not use with alcohol drugs or dangerous activities including driving.

## 2024-06-07 NOTE — ED Provider Triage Note (Signed)
 Emergency Medicine Provider Triage Evaluation Note  DARRAGH NAY , a 38 y.o. male  was evaluated in triage.  Pt complains of right leg swelling.  Review of Systems  Positive: Knee pain, lower leg swelling Negative: Significant pain, fever, injury  Physical Exam  BP (!) 141/98 (BP Location: Right Arm)   Pulse (!) 108   Temp 99.1 F (37.3 C)   Resp 18   Ht 5' 6 (1.676 m)   Wt 93 kg   SpO2 100%   BMI 33.09 kg/m  Gen:   Awake, no distress   Resp:  Normal effort  MSK:   Moves extremities without difficulty  Other:    Medical Decision Making  Medically screening exam initiated at 4:46 PM.  Appropriate orders placed.  JAHDEN SCHARA was informed that the remainder of the evaluation will be completed by another provider, this initial triage assessment does not replace that evaluation, and the importance of remaining in the ED until their evaluation is complete.  Patient with Psoriatic arthritis, currently off treatments for months, presents with 1 1/2 weeks of right knee pain and swelling without injury. No redness. No fever. Over the last couple of days the swelling extended distally to include the foot and ankle. No h/o clots. No SOB, CP. Declines pain medication from triage.    Odell Balls, PA-C 06/07/24 1648

## 2024-06-07 NOTE — ED Provider Notes (Signed)
 [place] EMERGENCY DEPARTMENT AT [facility] Provider Note   CSN: [id number] Arrival date & time: 06/07/24  1542     Patient presents with: Leg Swelling   Christopher Stokes is a 38 y.o. male.   Patient is here with right knee pain.  Swelling for the last few days.  Works as a Financial risk analyst.  Denies any fever or chills.  Denies any IV drug use.  History of gout in the left foot in the past.  History of RA as well.  He takes Cosentyx  but has been off of it for 6 months.  History of psoriatic arthritis.  Denies any weakness numbness tingling.  Denies any trauma.  The history is provided by the patient.       Prior to Admission medications   Medication Sig Start Date End Date Taking? Authorizing Provider  methylPREDNISolone  (MEDROL  DOSEPAK) 4 MG TBPK tablet Follow package insert 06/07/24  Yes Dominic Rhome, DO  acetaminophen  (TYLENOL ) 500 MG tablet Take 1,000 mg by mouth every 6 (six) hours as needed for mild pain (pain score 1-3).    [provider]  celecoxib  (CELEBREX ) 200 MG capsule Take 1 capsule (200 mg total) by mouth 2 (two) times daily. Patient not taking: Reported on 12/22/2023 11/19/22   Boscia, Heather E, NP  Naproxen Sodium (ALEVE PO) Take by mouth. Patient not taking: Reported on 12/22/2023    [provider]  Secukinumab  (COSENTYX  UNOREADY) 300 MG/2ML SOAJ Inject 300 mg into the skin every 28 (twenty-eight) days. 10/26/23   Rice, Lonni ORN, MD  Secukinumab  (COSENTYX  UNOREADY) 300 MG/2ML SOAJ Inject 300 mg into the skin once a week. Weeks 0, 1, 2, 3, 4 (shipped from PAP to clinic for new start visit) Patient not taking: Reported on 12/22/2023 10/26/23   Jeannetta Lonni ORN, MD    Allergies: Patient has no known allergies.    Review of Systems  Updated Vital Signs BP (!) 141/98 (BP Location: Right Arm)   Pulse (!) 108   Temp 99.1 F (37.3 C)   Resp 18   Ht 5' 6 (1.676 m)   Wt 93 kg   SpO2 100%   BMI 33.09 kg/m   Physical Exam Vitals and  nursing note reviewed.  Constitutional:      General: He is not in acute distress.    Appearance: He is well-developed. He is not ill-appearing.  HENT:     Head: Normocephalic and atraumatic.  Eyes:     Extraocular Movements: Extraocular movements intact.     Conjunctiva/sclera: Conjunctivae normal.     Pupils: Pupils are equal, round, and reactive to light.  Cardiovascular:     Rate and Rhythm: Normal rate and regular rhythm.     Pulses: Normal pulses.     Heart sounds: No murmur heard. Pulmonary:     Effort: Pulmonary effort is normal. No respiratory distress.     Breath sounds: Normal breath sounds.  Abdominal:     Palpations: Abdomen is soft.     Tenderness: There is no abdominal tenderness.  Musculoskeletal:        General: Swelling and tenderness present.     Cervical back: Normal range of motion and neck supple.     Comments: Swelling and tenderness of the right kneecap somewhat extending to the lower leg, there is no redness warmth cellulitis, slightly decreased range of motion of the knee secondary to discomfort  Skin:    General: Skin is warm and dry.     Capillary  Refill: Capillary refill takes less than 2 seconds.  Neurological:     General: No focal deficit present.     Mental Status: He is alert and oriented to person, place, and time.     Sensory: No sensory deficit.     Motor: No weakness.  Psychiatric:        Mood and Affect: Mood normal.     (all labs ordered are listed, but only abnormal results are displayed) Labs Reviewed - No data to display  EKG: None  Radiology: US  Venous Img Lower Unilateral Right (DVT) Result Date: 06/07/2024 CLINICAL DATA:  13689 Swelling 13689 EXAM: RIGHT LOWER EXTREMITY VENOUS DOPPLER ULTRASOUND TECHNIQUE: Gray-scale sonography with graded compression, as well as color Doppler and duplex ultrasound were performed to evaluate the lower extremity deep venous systems from the level of the common femoral vein and including the  common femoral, femoral, profunda femoral, popliteal and calf veins including the posterior tibial, peroneal and gastrocnemius veins when visible. The superficial great saphenous vein was also interrogated. Spectral Doppler was utilized to evaluate flow at rest and with distal augmentation maneuvers in the common femoral, femoral and popliteal veins. COMPARISON:  None Available. FINDINGS: Contralateral Common Femoral Vein: Respiratory phasicity is normal and symmetric with the symptomatic side. No evidence of thrombus. Normal compressibility. Common Femoral Vein: No evidence of thrombus. Normal compressibility, respiratory phasicity and response to augmentation. Saphenofemoral Junction: No evidence of thrombus. Normal compressibility and flow on color Doppler imaging. Profunda Femoral Vein: No evidence of thrombus. Normal compressibility and flow on color Doppler imaging. Femoral Vein: No evidence of thrombus. Normal compressibility, respiratory phasicity and response to augmentation. Popliteal Vein: No evidence of thrombus. Normal compressibility, respiratory phasicity and response to augmentation. Calf Veins: No evidence of thrombus. Normal compressibility and flow on color Doppler imaging. Superficial Great Saphenous Vein: No evidence of thrombus. Normal compressibility. Other Findings:  None. IMPRESSION: Negative for deep venous thrombosis in the right leg. Electronically Signed   By: Rogelia Myers M.D.   On: 06/07/2024 17:46   DG Knee Complete 4 Views Right Result Date: 06/07/2024 CLINICAL DATA:  Swelling. EXAM: RIGHT KNEE - COMPLETE 4+ VIEW COMPARISON:  None Available. FINDINGS: No acute fracture or dislocation. No aggressive osseous lesion. The knee joint appears within normal limits. No significant arthritis. There is small-to-moderate suprapatellar knee joint effusion. No focal soft tissue swelling. No radiopaque foreign bodies. IMPRESSION: No acute osseous abnormality of the right knee joint. Probable  small-to-moderate suprapatellar knee joint effusion. Electronically Signed   By: Ree Molt M.D.   On: 06/07/2024 17:02     Procedures   Medications Ordered in the ED - No data to display                                  Medical Decision Making Amount and/or Complexity of Data Reviewed Radiology: ordered.  Risk Prescription drug management.   Christopher Stokes is here with right knee pain.  History of arthritis/gout/may be RA.  He used to be on medication for this but no longer taking medication for it.  He has no fever.  He is well-appearing.  I do not appreciate any infectious process to the right knee.  He can flex and extend fairly well but with discomfort.  Swelling mostly in the prepatellar space.  Will get ultrasound to rule out DVT.  Will get x-ray to evaluate for effusion fracture.  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  Denies any specific trauma.  He is neurovascular neuromuscular intact otherwise.  DVT study negative for DVT.  X-ray shows likely prepatellar effusion.  Overall I do suspect inflammatory process.  Have no concern for infectious process including septic joint.  Will put him on steroids and have him do an Ace wrap.  Minimal activity.  Ice and follow-up with orthopedics.  Discharged in good condition.  Understands return precautions.  Recommend Tylenol  for pain as well.  This chart was dictated using voice recognition software.  Despite best efforts to proofread,  errors can occur which can change the documentation meaning.      Final diagnoses:  Patellar bursitis of right knee    ED Discharge Orders          Ordered    methylPREDNISolone  (MEDROL  DOSEPAK) 4 MG TBPK tablet        06/07/24 1907               Ruthe Cornet, DO 06/07/24 1912

## 2024-06-07 NOTE — ED Provider Notes (Signed)
 [facility]    CSN: [id number] Arrival date & time: 06/07/24  1100      History   Chief Complaint Chief Complaint  Patient presents with   Leg Swelling    from knee to foot - Entered by patient    HPI Christopher Stokes is a 38 y.o. male with past medical history of psoriatic arthritis and elevated LFTs presents for leg swelling.  Patient reports 1 week of worsening right leg swelling that began at his knee and has extended up to his foot into his mid thigh.  He denies any known injury or inciting event.  States the swelling does not improve with elevation.  He states he has pain to the anterior and posterior aspect of the knee.  Denies any erythema warmth.  No long distance travel.  Denies orthopnea, chest pain, or shortness of breath.  No history of DVT.  No other concerns at this time.  HPI  Past Medical History:  Diagnosis Date   Psoriatic arthritis [facility])     Patient Active Problem List   Diagnosis Date Noted   High risk medication use 01/29/2023   Rash and other nonspecific skin eruption 01/02/2023   Elevated antinuclear antibody (ANA) level 10/06/2022   Nonspecific abnormal results of liver function study 10/06/2022   Other fatigue 09/21/2022   Psoriatic arthritis (HCC) 09/21/2022   Acute appendicitis 03/01/2018    Past Surgical History:  Procedure Laterality Date   LAPAROSCOPIC APPENDECTOMY N/A 03/01/2018   Procedure: APPENDECTOMY LAPAROSCOPIC;  Surgeon: Stevie Herlene Righter, MD;  Location: [facility] OR;  Service: General;  Laterality: N/A;       Home Medications    Prior to Admission medications   Medication Sig Start Date End Date Taking? Authorizing Provider  acetaminophen  (TYLENOL ) 500 MG tablet Take 1,000 mg by mouth every 6 (six) hours as needed for mild pain (pain score 1-3).    [provider]  celecoxib  (CELEBREX ) 200 MG capsule Take 1 capsule (200 mg total) by mouth 2 (two) times daily. Patient not taking: Reported on 12/22/2023 11/19/22    Boscia, Heather E, NP  Naproxen Sodium (ALEVE PO) Take by mouth. Patient not taking: Reported on 12/22/2023    [provider]  Secukinumab  (COSENTYX  UNOREADY) 300 MG/2ML SOAJ Inject 300 mg into the skin every 28 (twenty-eight) days. 10/26/23   Rice, Lonni ORN, MD  Secukinumab  (COSENTYX  UNOREADY) 300 MG/2ML SOAJ Inject 300 mg into the skin once a week. Weeks 0, 1, 2, 3, 4 (shipped from PAP to clinic for new start visit) Patient not taking: Reported on 12/22/2023 10/26/23   Jeannetta Lonni ORN, MD    Family History Family History  Problem Relation Age of Onset   Diabetes Father    High Cholesterol Father    High blood pressure Father    High blood pressure Brother    High Cholesterol Brother     Social History Social History   Tobacco Use   Smoking status: Never    Passive exposure: Never   Smokeless tobacco: Never  Vaping Use   Vaping status: Never Used  Substance Use Topics   Alcohol use: Not Currently    Alcohol/week: 30.0 standard drinks of alcohol    Types: 30 Cans of beer per week   Drug use: Never     Allergies   Patient has no known allergies.   Review of Systems Review of Systems  Skin:        Right leg swelling  Physical Exam Triage Vital Signs ED Triage Vitals  Encounter Vitals Group     BP 06/07/24 1131 132/88     Girls Systolic BP Percentile --      Girls Diastolic BP Percentile --      Boys Systolic BP Percentile --      Boys Diastolic BP Percentile --      Pulse Rate 06/07/24 1131 96     Resp 06/07/24 1131 16     Temp 06/07/24 1131 98.6 F (37 C)     Temp Source 06/07/24 1131 Oral     SpO2 06/07/24 1131 97 %     Weight --      Height --      Head Circumference --      Peak Flow --      Pain Score 06/07/24 1130 6     Pain Loc --      Pain Education --      Exclude from Growth Chart --    No data found.  Updated Vital Signs BP 132/88   Pulse 96   Temp 98.6 F (37 C) (Oral)   Resp 16   SpO2 97%   Visual  Acuity Right Eye Distance:   Left Eye Distance:   Bilateral Distance:    Right Eye Near:   Left Eye Near:    Bilateral Near:     Physical Exam Vitals and nursing note reviewed.  Constitutional:      General: He is not in acute distress.    Appearance: Normal appearance. He is not ill-appearing, toxic-appearing or diaphoretic.  HENT:     Head: Normocephalic and atraumatic.  Eyes:     Pupils: Pupils are equal, round, and reactive to light.  Cardiovascular:     Rate and Rhythm: Normal rate.  Pulmonary:     Effort: Pulmonary effort is normal.  Musculoskeletal:     Right lower leg: Swelling present. 2+ Edema present.     Comments: Right calf measures 43 cm left measures 39cm.  Tender to palpation to anterior posterior knee.  There is no tenderness to palpation to posterior calf. DP+2  Skin:    General: Skin is warm and dry.  Neurological:     General: No focal deficit present.     Mental Status: He is alert and oriented to person, place, and time.  Psychiatric:        Mood and Affect: Mood normal.        Behavior: Behavior normal.      UC Treatments / Results  Labs (all labs ordered are listed, but only abnormal results are displayed) Labs Reviewed - No data to display  EKG   Radiology No results found.  Procedures Procedures (including critical care time)  Medications Ordered in UC Medications - No data to display  Initial Impression / Assessment and Plan / UC Course  I have reviewed the triage vital signs and the nursing notes.  Pertinent labs & imaging results that were available during my care of the patient were reviewed by me and considered in my medical decision making (see chart for details).     I reviewed exam and symptoms with patient.  Patient presenting with 1 week of worsening right lower extremity swelling.  No known injury or identifiable cause.  Given the amount of swelling advised to go to the ER for further workup/rule out vascular causes.   He is in agreement with plan will go POV to the emergency room. Final Clinical  Impressions(s) / UC Diagnoses   Final diagnoses:  Right leg swelling     Discharge Instructions      Please go to the emergency room for further evaluation of your leg swelling    ED Prescriptions   None    PDMP not reviewed this encounter.   Loreda Myla SAUNDERS, NP 06/07/24 [phone]

## 2024-06-07 NOTE — ED Triage Notes (Signed)
 Pt via pov from [facility] with swollen right leg from knee all the way to his feet. He states he has hx of gout in his left foot; he also has RA. Pt was taking cosentix, has been off for 6 months. Pt limped to triage room. Knee is double the size of the left knee. Pt alert & oriented, nad noted.

## 2024-06-07 NOTE — ED Triage Notes (Signed)
 Pt c/o right leg swelling a little over a week. Pt has 2+ pitting edema of right leg. Pt has 2+ right pedal pulse, cap refill less than 3 sec, warm to touch, able to wiggle toes. Pt limped to exam room.

## 2024-06-09 NOTE — Progress Notes (Signed)
 Office Visit Note  Patient: Christopher Stokes             Date of Birth: 09/13/1986           MRN: [id number]             PCP: Pcp, No Referring: Hanford Powell BRAVO, NP Visit Date: 06/23/2024   Subjective:  Follow-up  Discussed the use of AI scribe software for clinical note transcription with the patient, who gave verbal consent to proceed.  History of Present Illness   Christopher Stokes is a 38 year old male with psoriatic arthritis who presents with multiple swollen joints and recurrent rashes after several months off Cosentyx .  Approximately one month ago, he began experiencing increased joint swelling most pronounced at his right knee. He visited the emergency department and received a steroid taper, which provided temporary relief, but the swelling has since returned since he completed the steroid course last week.  He has difficulty closing his hands due to pain and swelling in multiple fingers. He also experiences left knee pain and swelling and in multiple toes.  He also has a scalp rash with scaling behind the left ear and a small patch on the back of the hairline. There is a possible rash on the abdomen, particularly around the belly button, and some scaling in the groin area.     Previous HPI 12/22/2023 Christopher Stokes is a 38 y.o. male here for follow up for psoriatic arthritis.  He reports significant improvement over the past two months since starting Cosentyx . They note the absence of pain and reduced morning aches, although they mention a recent mild resurgence of discomfort. The patient also reports a noticeable improvement in skin rashes, which are now less severe.   The patient experiences itchiness around the shoulders for a couple of hours following the Cosentyx  injections. However, they have discontinued over-the-counter pain medications such as ibuprofen, Aleve, and Tylenol  since starting Cosentyx , indicating a significant reduction in pain.   The patient also reports  an episode of a cold-like illness about a month ago, which resolved without antibiotics. They have been able to continue working without difficulty and have noticed an improvement in their grip strength. The patient acknowledges that the left hand still shows more signs of disease than the right, particularly in the fifth finger joint and other finger joints. Despite this, the overall trajectory of their condition appears to be improving.    Previous HPI 10/20/2023 Christopher Stokes is a 38 y.o. male here for follow up for psoriatic arthritis.  After visit earlier this year the plan was to start him on Cosentyx  injections due to multiple swollen joints and active skin disease.  He never followed up so did not start treatment for this.  He is back today due to symptom exacerbation.  He is having increased swelling especially in his right hand worse for the past few weeks.  He does not recall an immediately preceding event he was sick with some type of viral URI symptoms about 2 months ago but these resolved uncomplicated.   Previous HPI 01/29/23 Christopher Stokes is a 38 y.o. male here for follow up for psoriatic arthritis workup at initial visit demonstrated markedly elevated liver function test.  He has not yet scheduled for the recommend right upper quadrant ultrasound to evaluate this.  Within the past weeks had progressive worsening of swelling in both knees pain with weightbearing continues to have swelling throughout multiple fingers of  both hands.  He is taking the Celebrex  consistently which is partially helpful.  No noticeable change in skin rashes.   Previous HPI 01/02/23 Christopher Stokes is a 38 y.o. male here for evaluation of pain and swelling affecting both hands and feet and abnormal labs with positive ANA.  Symptom onset was nonspecific for sought medical attention for this in October 2022 with joint pain swelling decreased range of motion most severely affected site at his right third finger  initially.  He has had a couple episodic flareups of this over time which have been treated with oral prednisone  with near complete resolution of symptoms while on the steroids and tends to stay improved for at least a few weeks before redeveloping symptoms.  He does have persistent symptoms in between flareups.  Symptoms are most severe with prolonged morning stiffness his flexibility increases somewhat over the course of the day.  Hands and feet are both affected but hands worst he is also been noticing a bit more daily symptoms with the colder temperature this winter.  He is also been prescribed Celebrex  has tried using over-the-counter NSAIDs but does not notice of a large difference in symptoms. He is also noticed skin rash and redness ongoing for at least since before he is joint swelling symptoms began but with insidious or nonspecific onset.  Has rashes on his scalp in the bellybutton notices some on the skin creases in his groin.  He tried using an over-the-counter medicated shampoo but did not see a large difference in symptoms.  While taking the prednisone  tapers he thinks the rashes were less itchy but never really resolved.  He has never seen dermatology for this problem. He denies any new oral or nasal ulcers, lymphadenopathy, Raynaud's symptoms, or skin photosensitivity.  No history of abnormal bleeding or blood clots.   Labs reviewed 08/2022 ANA pos RNP 1.2 dsDNA, Sm, Scl-70, SSA, SSB, chromatin, Jo-1, centromere neg RF neg ESR 19 CRP 2 AST 112 ALT 289   08/2021 ANA neg RF neg CCP neg ESR 31 CRP 14 Uric acid 5.0   Review of Systems  Constitutional:  Positive for fatigue.  HENT:  Negative for mouth sores and mouth dryness.   Eyes:  Negative for dryness.  Respiratory:  Negative for shortness of breath.   Cardiovascular:  Negative for chest pain and palpitations.  Gastrointestinal:  Negative for blood in stool, constipation and diarrhea.  Endocrine: Negative for increased  urination.  Genitourinary:  Negative for involuntary urination.  Musculoskeletal:  Positive for joint pain, gait problem, joint pain, myalgias, muscle weakness, morning stiffness, muscle tenderness and myalgias. Negative for joint swelling.  Skin:  Positive for rash. Negative for color change, hair loss and sensitivity to sunlight.  Allergic/Immunologic: Negative for susceptible to infections.  Neurological:  Negative for dizziness and headaches.  Hematological:  Negative for swollen glands.  Psychiatric/Behavioral:  Positive for sleep disturbance. Negative for depressed mood. The patient is not nervous/anxious.     PMFS History:  Patient Active Problem List   Diagnosis Date Noted   High risk medication use 01/29/2023   Rash and other nonspecific skin eruption 01/02/2023   Elevated antinuclear antibody (ANA) level 10/06/2022   Nonspecific abnormal results of liver function study 10/06/2022   Other fatigue 09/21/2022   Psoriatic arthritis (HCC) 09/21/2022   Acute appendicitis 03/01/2018    Past Medical History:  Diagnosis Date   Bursitis 06/13/2024   Psoriatic arthritis (HCC)     Family History  Problem Relation Age  of Onset   Diabetes Father    High Cholesterol Father    High blood pressure Father    High blood pressure Brother    High Cholesterol Brother    Past Surgical History:  Procedure Laterality Date   LAPAROSCOPIC APPENDECTOMY N/A 03/01/2018   Procedure: APPENDECTOMY LAPAROSCOPIC;  Surgeon: Kinsinger, Herlene Righter, MD;  Location: [facility] OR;  Service: General;  Laterality: N/A;   Social History   Social History Narrative   Not on file    There is no immunization history on file for this patient.   Objective: Vital Signs: BP 122/79 (BP Location: Left Arm, Patient Position: Sitting)   Pulse (!) 111   Resp 16   Ht 5' 6 (1.676 m)   Wt 188 lb 9.6 oz (85.5 kg)   BMI 30.44 kg/m    Physical Exam Eyes:     Conjunctiva/sclera: Conjunctivae normal.  Cardiovascular:      Rate and Rhythm: Normal rate and regular rhythm.  Pulmonary:     Effort: Pulmonary effort is normal.     Breath sounds: Normal breath sounds.  Lymphadenopathy:     Cervical: No cervical adenopathy.  Skin:    General: Skin is warm and dry.     Findings: Rash present.     Comments: Scalp rash with scaling behind left ear. Small patch on back of hairline. In and about 1 cm radius around umbilicus. No digital pitting or nail changes  Neurological:     Mental Status: He is alert.  Psychiatric:        Mood and Affect: Mood normal.      Musculoskeletal Exam:  Neck full ROM no tenderness Shoulders full ROM no tenderness or swelling Elbows full ROM no tenderness or swelling Wrists full ROM no tenderness or swelling Fingers restriction in flexion range of motion, palpable swelling and tenderness in right second DIP third PIP and fifth DIP, left second PIP and fourth DIP Right knee with moderate effusion, hot to touch, tenderness to pressure most severe anteriorly and very painful with flexion and extension range of motion limited Left knee trace swelling tenderness to pressure worse on medial joint line and pain with flexion and extension Synovitis in right 4th and 5th toes and left second DIP   Investigation: No additional findings.  Imaging: US  Venous Img Lower Unilateral Right (DVT) Result Date: 06/07/2024 CLINICAL DATA:  13689 Swelling 13689 EXAM: RIGHT LOWER EXTREMITY VENOUS DOPPLER ULTRASOUND TECHNIQUE: Gray-scale sonography with graded compression, as well as color Doppler and duplex ultrasound were performed to evaluate the lower extremity deep venous systems from the level of the common femoral vein and including the common femoral, femoral, profunda femoral, popliteal and calf veins including the posterior tibial, peroneal and gastrocnemius veins when visible. The superficial great saphenous vein was also interrogated. Spectral Doppler was utilized to evaluate flow at rest and with  distal augmentation maneuvers in the common femoral, femoral and popliteal veins. COMPARISON:  None Available. FINDINGS: Contralateral Common Femoral Vein: Respiratory phasicity is normal and symmetric with the symptomatic side. No evidence of thrombus. Normal compressibility. Common Femoral Vein: No evidence of thrombus. Normal compressibility, respiratory phasicity and response to augmentation. Saphenofemoral Junction: No evidence of thrombus. Normal compressibility and flow on color Doppler imaging. Profunda Femoral Vein: No evidence of thrombus. Normal compressibility and flow on color Doppler imaging. Femoral Vein: No evidence of thrombus. Normal compressibility, respiratory phasicity and response to augmentation. Popliteal Vein: No evidence of thrombus. Normal compressibility, respiratory phasicity and response to augmentation.  Calf Veins: No evidence of thrombus. Normal compressibility and flow on color Doppler imaging. Superficial Great Saphenous Vein: No evidence of thrombus. Normal compressibility. Other Findings:  None. IMPRESSION: Negative for deep venous thrombosis in the right leg. Electronically Signed   By: Rogelia Myers M.D.   On: 06/07/2024 17:46   DG Knee Complete 4 Views Right Result Date: 06/07/2024 CLINICAL DATA:  Swelling. EXAM: RIGHT KNEE - COMPLETE 4+ VIEW COMPARISON:  None Available. FINDINGS: No acute fracture or dislocation. No aggressive osseous lesion. The knee joint appears within normal limits. No significant arthritis. There is small-to-moderate suprapatellar knee joint effusion. No focal soft tissue swelling. No radiopaque foreign bodies. IMPRESSION: No acute osseous abnormality of the right knee joint. Probable small-to-moderate suprapatellar knee joint effusion. Electronically Signed   By: Ree Molt M.D.   On: 06/07/2024 17:02    Recent Labs: Lab Results  Component Value Date   WBC 11.3 (H) 06/23/2024   HGB 14.1 06/23/2024   PLT 391 06/23/2024   NA 135 06/23/2024    K 3.9 06/23/2024   CL 100 06/23/2024   CO2 26 06/23/2024   GLUCOSE 86 06/23/2024   BUN 11 06/23/2024   CREATININE 0.73 06/23/2024   BILITOT 1.1 06/23/2024   ALKPHOS 72 11/19/2022   AST 20 06/23/2024   ALT 35 06/23/2024   PROT 7.9 06/23/2024   ALBUMIN 4.1 11/19/2022   CALCIUM 9.5 06/23/2024   GFRAA >60 03/01/2018   QFTBGOLDPLUS NEGATIVE 01/29/2023    Speciality Comments: Cosentyx  started 10/26/23  Procedures:  No procedures performed Allergies: Patient has no known allergies.   Assessment / Plan:     Visit Diagnoses: Psoriatic arthritis (HCC) - Plan: predniSONE  (DELTASONE ) 10 MG tablet, CBC with Differential/Platelet, Comprehensive metabolic panel with GFR, QuantiFERON-TB Gold Plus Psoriatic arthritis with multiple swollen joints, worsening over the past month. Previous Cosentyx  treatment was beneficial but interrupted. Recent steroid use provided temporary relief. - Oral prednisone  taper starting at 40 mg daily down over next month for highly active disease - Resume Cosentyx  300 mg [place] monthly after loading dose series  High risk medication use - Cosentyx  300 mg subcu monthly - Plan: CBC with Differential/Platelet, Comprehensive metabolic panel with GFR, QuantiFERON-TB Gold Plus Reviewed risks of cosentyx , was tolerating injections without issue after starting end of last year. No recent serious infections. Current joint inflammation and rashes 2/2 PsA no concern for cellulitis. Prevous moderately high AST/ALT elevations suspected as NAFLD, rechecking today. 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      Orders: Orders Placed This Encounter  Procedures   CBC with Differential/Platelet   Comprehensive metabolic panel with GFR   QuantiFERON-TB Gold Plus   Meds ordered this encounter  Medications   predniSONE  (DELTASONE ) 10 MG  tablet    Sig: Take 4 tablets (40 mg total) by mouth daily with breakfast for 7 days, THEN 3 tablets (30 mg total) daily with breakfast for 7 days, THEN 2 tablets (20 mg total) daily with breakfast for 7 days, THEN 1 tablet (10 mg total) daily with breakfast for 7 days.    Dispense:  70 tablet    Refill:  0     Follow-Up Instructions: Return in about 3 months (around 09/23/2024) for PsA on COS f/u 3mos.   Lonni LELON Ester, MD  Note - This record has been created using [employer].  Chart creation errors have  been sought, but may not always  have been located. Such creation errors do not reflect on  the standard of medical care.

## 2024-06-13 DIAGNOSIS — M719 Bursopathy, unspecified: Secondary | ICD-10-CM

## 2024-06-13 HISTORY — DX: Bursopathy, unspecified: M71.9

## 2024-06-23 ENCOUNTER — Telehealth: Payer: Self-pay

## 2024-06-23 ENCOUNTER — Encounter: Payer: Self-pay | Admitting: Internal Medicine

## 2024-06-23 ENCOUNTER — Encounter: Payer: Self-pay | Admitting: Pharmacist

## 2024-06-23 ENCOUNTER — Ambulatory Visit: Payer: Self-pay | Attending: Internal Medicine | Admitting: Internal Medicine

## 2024-06-23 VITALS — BP 122/79 | HR 111 | Resp 16 | Ht 66.0 in | Wt 188.6 lb

## 2024-06-23 DIAGNOSIS — Z79899 Other long term (current) drug therapy: Secondary | ICD-10-CM

## 2024-06-23 DIAGNOSIS — L4050 Arthropathic psoriasis, unspecified: Secondary | ICD-10-CM

## 2024-06-23 DIAGNOSIS — Z111 Encounter for screening for respiratory tuberculosis: Secondary | ICD-10-CM

## 2024-06-23 DIAGNOSIS — R945 Abnormal results of liver function studies: Secondary | ICD-10-CM

## 2024-06-23 DIAGNOSIS — R21 Rash and other nonspecific skin eruption: Secondary | ICD-10-CM

## 2024-06-23 DIAGNOSIS — M1990 Unspecified osteoarthritis, unspecified site: Secondary | ICD-10-CM

## 2024-06-23 MED ORDER — PREDNISONE 10 MG PO TABS: ORAL_TABLET | ORAL | 0 refills | Status: AC

## 2024-06-23 NOTE — Patient Instructions (Signed)
 We will work on re-applying for Cosentyx  through the patient assistance program for you  Company will be in touch with you directly with update  [employer] Phone: [phone]

## 2024-06-23 NOTE — Telephone Encounter (Signed)
 Patient at [facility] today. Will need to restart Cosentyx  (restart at home). He is uninsured  Dose: 300mg  subcut at Weeks 0, 1, 2, 3, 4 then every 4 weeks thereafter  Patient completed patient form for [employer] PAP. He brought income documents as well. Provider form signed by Dr. Jeannetta. Application scanned to Onbase. Submission pending labs from today to result   Sherry Pennant, PharmD, MPH, BCPS, CPP Clinical Pharmacist (Rheumatology and Pulmonology)

## 2024-06-27 LAB — QUANTIFERON-TB GOLD PLUS
Mitogen-NIL: 7.61 [IU]/mL
NIL: 0.02 [IU]/mL
QuantiFERON-TB Gold Plus: NEGATIVE
TB1-NIL: 0 [IU]/mL
TB2-NIL: 0.01 [IU]/mL

## 2024-06-27 LAB — COMPREHENSIVE METABOLIC PANEL WITH GFR
AG Ratio: 0.8 (calc) — ABNORMAL LOW (ref 1.0–2.5)
ALT: 35 U/L (ref 9–46)
AST: 20 U/L (ref 10–40)
Albumin: 3.6 g/dL (ref 3.6–5.1)
Alkaline phosphatase (APISO): 71 U/L (ref 36–130)
BUN: 11 mg/dL (ref 7–25)
CO2: 26 mmol/L (ref 20–32)
Calcium: 9.5 mg/dL (ref 8.6–10.3)
Chloride: 100 mmol/L (ref 98–110)
Creat: 0.73 mg/dL (ref 0.60–1.26)
Globulin: 4.3 g/dL — ABNORMAL HIGH (ref 1.9–3.7)
Glucose, Bld: 86 mg/dL (ref 65–99)
Potassium: 3.9 mmol/L (ref 3.5–5.3)
Sodium: 135 mmol/L (ref 135–146)
Total Bilirubin: 1.1 mg/dL (ref 0.2–1.2)
Total Protein: 7.9 g/dL (ref 6.1–8.1)
eGFR: 122 mL/min/1.73m2 (ref >=60)

## 2024-06-27 LAB — CBC WITH DIFFERENTIAL/PLATELET
Absolute Lymphocytes: 2317 {cells}/uL (ref 850–3900)
Absolute Monocytes: 1220 {cells}/uL — ABNORMAL HIGH (ref 200–950)
Basophils Absolute: 34 {cells}/uL (ref 0–200)
Basophils Relative: 0.3 %
Eosinophils Absolute: 79 {cells}/uL (ref 15–500)
Eosinophils Relative: 0.7 %
HCT: 43.5 % (ref 38.5–50.0)
Hemoglobin: 14.1 g/dL (ref 13.2–17.1)
MCH: 29 pg (ref 27.0–33.0)
MCHC: 32.4 g/dL (ref 32.0–36.0)
MCV: 89.5 fL (ref 80.0–100.0)
MPV: 9.9 fL (ref 7.5–12.5)
Monocytes Relative: 10.8 %
Neutro Abs: 7650 {cells}/uL (ref 1500–7800)
Neutrophils Relative %: 67.7 %
Platelets: 391 Thousand/uL (ref 140–400)
RBC: 4.86 Million/uL (ref 4.20–5.80)
RDW: 11.8 % (ref 11.0–15.0)
Total Lymphocyte: 20.5 %
WBC: 11.3 Thousand/uL — ABNORMAL HIGH (ref 3.8–10.8)

## 2024-06-28 NOTE — Telephone Encounter (Signed)
 TB gold negative  Submitted Patient Assistance Application to [employer] for COSENTYX  SQ along with provider portion, patient portion, medication list, and income documents. Patient is uninsured. Will update patient when we receive a response.  Phone: [phone] Fax: [phone]

## 2024-06-30 ENCOUNTER — Telehealth: Payer: Self-pay | Admitting: Internal Medicine

## 2024-06-30 MED ORDER — COSENTYX UNOREADY 300 MG/2ML ~~LOC~~ SOAJ
300.0000 mg | SUBCUTANEOUS | 1 refills | Status: DC
Start: 2024-06-30 — End: 2024-09-29

## 2024-06-30 MED ORDER — COSENTYX UNOREADY 300 MG/2ML ~~LOC~~ SOAJ
300.0000 mg | SUBCUTANEOUS | 0 refills | Status: DC
Start: 2024-06-30 — End: 2024-09-29

## 2024-06-30 NOTE — Telephone Encounter (Signed)
 Darolyn from [employer] left a voicemail regarding patient's Cosentyx  loading dose.  Is the medication being sent to the office or the patient's home.  Please call back at #[phone]

## 2024-06-30 NOTE — Telephone Encounter (Signed)
 Called [employer] - provided consent to ship to patient's home  Sherry Pennant, PharmD, MPH, BCPS, CPP Clinical Pharmacist (Rheumatology and Pulmonology)

## 2024-06-30 NOTE — Telephone Encounter (Signed)
 Received a fax from  [employer] regarding an approval for COSENTYX  SQ patient assistance from 06/29/2024 to 06/29/2025. Approval letter sent to scan center.  Phone: [phone] Fax: [phone] Patient ID: [id number]    Patient notified via MyChart   Sherry Pennant, PharmD, MPH, BCPS, CPP Clinical Pharmacist (Rheumatology and Pulmonology)

## 2024-09-19 NOTE — Progress Notes (Signed)
 Office Visit Note  Patient: Christopher Stokes             Date of Birth: 1986/05/08           MRN: [id number]             PCP: Pcp, No Referring: No ref. provider found Visit Date: 09/29/2024   Subjective:   Discussed the use of AI scribe software for clinical note transcription with the patient, who gave verbal consent to proceed.  History of Present Illness   Christopher Stokes is a 38 y.o. male here for follow up with psoriatic arthritis who presents with multiple swollen joints and recurrent rashes after several months off Cosentyx .   Since resuming Cosentyx , his symptoms have improved significantly. He notes a reduction in swelling, although he still experiences some difficulty making a tight fist. He describes the swelling as 'better than before.'  He experiences dry skin and occasional rashes, particularly on his hands, which he attributes to frequent hand washing. The rashes are not currently present on his legs, but he has had them on his back in the past.  His knees are better than before, although they still hurt when he raises them. He is no longer taking prednisone .  No recent infections, stating only 'a few sneezing here and there.' He is currently on Cosentyx .       Previous HPI 06/23/2024 Christopher Stokes is a 38 year old male with psoriatic arthritis who presents with multiple swollen joints and recurrent rashes after several months off Cosentyx .   Approximately one month ago, he began experiencing increased joint swelling most pronounced at his right knee. He visited the emergency department and received a steroid taper, which provided temporary relief, but the swelling has since returned since he completed the steroid course last week.   He has difficulty closing his hands due to pain and swelling in multiple fingers. He also experiences left knee pain and swelling and in multiple toes.   He also has a scalp rash with scaling behind the left ear and a small patch on the  back of the hairline. There is a possible rash on the abdomen, particularly around the belly button, and some scaling in the groin area.    Previous HPI 12/22/2023 Christopher Stokes is a 38 y.o. male here for follow up for psoriatic arthritis.  He reports significant improvement over the past two months since starting Cosentyx . They note the absence of pain and reduced morning aches, although they mention a recent mild resurgence of discomfort. The patient also reports a noticeable improvement in skin rashes, which are now less severe.   The patient experiences itchiness around the shoulders for a couple of hours following the Cosentyx  injections. However, they have discontinued over-the-counter pain medications such as ibuprofen, Aleve, and Tylenol  since starting Cosentyx , indicating a significant reduction in pain.   The patient also reports an episode of a cold-like illness about a month ago, which resolved without antibiotics. They have been able to continue working without difficulty and have noticed an improvement in their grip strength. The patient acknowledges that the left hand still shows more signs of disease than the right, particularly in the fifth finger joint and other finger joints. Despite this, the overall trajectory of their condition appears to be improving.    Previous HPI 10/20/2023 Christopher Stokes is a 38 y.o. male here for follow up for psoriatic arthritis.  After visit earlier this year the plan was  to start him on Cosentyx  injections due to multiple swollen joints and active skin disease.  He never followed up so did not start treatment for this.  He is back today due to symptom exacerbation.  He is having increased swelling especially in his right hand worse for the past few weeks.  He does not recall an immediately preceding event he was sick with some type of viral URI symptoms about 2 months ago but these resolved uncomplicated.   Previous HPI 01/29/23 Christopher Stokes is a 38  y.o. male here for follow up for psoriatic arthritis workup at initial visit demonstrated markedly elevated liver function test.  He has not yet scheduled for the recommend right upper quadrant ultrasound to evaluate this.  Within the past weeks had progressive worsening of swelling in both knees pain with weightbearing continues to have swelling throughout multiple fingers of both hands.  He is taking the Celebrex  consistently which is partially helpful.  No noticeable change in skin rashes.   Previous HPI 01/02/23 Christopher Stokes is a 38 y.o. male here for evaluation of pain and swelling affecting both hands and feet and abnormal labs with positive ANA.  Symptom onset was nonspecific for sought medical attention for this in October 2022 with joint pain swelling decreased range of motion most severely affected site at his right third finger initially.  He has had a couple episodic flareups of this over time which have been treated with oral prednisone  with near complete resolution of symptoms while on the steroids and tends to stay improved for at least a few weeks before redeveloping symptoms.  He does have persistent symptoms in between flareups.  Symptoms are most severe with prolonged morning stiffness his flexibility increases somewhat over the course of the day.  Hands and feet are both affected but hands worst he is also been noticing a bit more daily symptoms with the colder temperature this winter.  He is also been prescribed Celebrex  has tried using over-the-counter NSAIDs but does not notice of a large difference in symptoms. He is also noticed skin rash and redness ongoing for at least since before he is joint swelling symptoms began but with insidious or nonspecific onset.  Has rashes on his scalp in the bellybutton notices some on the skin creases in his groin.  He tried using an over-the-counter medicated shampoo but did not see a large difference in symptoms.  While taking the prednisone  tapers he  thinks the rashes were less itchy but never really resolved.  He has never seen dermatology for this problem. He denies any new oral or nasal ulcers, lymphadenopathy, Raynaud's symptoms, or skin photosensitivity.  No history of abnormal bleeding or blood clots.   Labs reviewed 08/2022 ANA pos RNP 1.2 dsDNA, Sm, Scl-70, SSA, SSB, chromatin, Jo-1, centromere neg RF neg ESR 19 CRP 2 AST 112 ALT 289   08/2021 ANA neg RF neg CCP neg ESR 31 CRP 14 Uric acid 5.0   Review of Systems  Constitutional:  Negative for fatigue.  HENT:  Negative for mouth sores and mouth dryness.   Eyes:  Negative for dryness.  Respiratory:  Negative for shortness of breath.   Cardiovascular:  Negative for chest pain and palpitations.  Gastrointestinal:  Negative for blood in stool, constipation and diarrhea.  Endocrine: Negative for increased urination.  Genitourinary:  Negative for involuntary urination.  Musculoskeletal:  Positive for joint pain and joint pain. Negative for gait problem, joint swelling, myalgias, muscle weakness, muscle tenderness and  myalgias.  Skin:  Positive for rash. Negative for color change, hair loss and sensitivity to sunlight.  Allergic/Immunologic: Negative for susceptible to infections.  Neurological:  Negative for dizziness and headaches.  Hematological:  Negative for swollen glands.  Psychiatric/Behavioral:  Negative for depressed mood and sleep disturbance. The patient is not nervous/anxious.     PMFS History:  Patient Active Problem List   Diagnosis Date Noted   High risk medication use 01/29/2023   Rash and other nonspecific skin eruption 01/02/2023   Elevated antinuclear antibody (ANA) level 10/06/2022   Nonspecific abnormal results of liver function study 10/06/2022   Other fatigue 09/21/2022   Psoriatic arthritis (HCC) 09/21/2022   Acute appendicitis 03/01/2018    Past Medical History:  Diagnosis Date   Bursitis 06/13/2024   Psoriatic arthritis (HCC)      Family History  Problem Relation Age of Onset   Diabetes Father    High Cholesterol Father    High blood pressure Father    High blood pressure Brother    High Cholesterol Brother    Past Surgical History:  Procedure Laterality Date   LAPAROSCOPIC APPENDECTOMY N/A 03/01/2018   Procedure: APPENDECTOMY LAPAROSCOPIC;  Surgeon: Kinsinger, Herlene Righter, MD;  Location: [facility] OR;  Service: General;  Laterality: N/A;   Social History   Social History Narrative   Not on file    There is no immunization history on file for this patient.   Objective: Vital Signs: BP (!) 136/90   Temp (!) 97.1 F (36.2 C)   Resp 16   Ht 5' 6 (1.676 m)   Wt 207 lb 3.2 oz (94 kg)   BMI 33.44 kg/m     Physical Exam Eyes:     Conjunctiva/sclera: Conjunctivae normal.  Cardiovascular:     Rate and Rhythm: Normal rate and regular rhythm.  Pulmonary:     Effort: Pulmonary effort is normal.     Breath sounds: Normal breath sounds.  Lymphadenopathy:     Cervical: No cervical adenopathy.  Skin:    General: Skin is warm and dry.     Findings: Rash present.     Comments: Dry skin on back and side of fingers worst  Neurological:     Mental Status: He is alert.  Psychiatric:        Mood and Affect: Mood normal.      Musculoskeletal Exam:  Shoulders full ROM no tenderness or swelling Elbow slightly restrcited flexion ROM Wrists full ROM no tenderness or swelling Fingers Left 5th PIP swelling, Right 3rd PIP swelling Knees full ROM no tenderness or swelling Ankles full ROM no tenderness or swelling    Investigation: No additional findings.  Imaging: No results found.  Recent Labs: Lab Results  Component Value Date   WBC 8.5 09/29/2024   HGB 16.7 09/29/2024   PLT 252 09/29/2024   NA 135 06/23/2024   K 3.9 06/23/2024   CL 100 06/23/2024   CO2 26 06/23/2024   GLUCOSE 86 06/23/2024   BUN 11 06/23/2024   CREATININE 0.73 06/23/2024   BILITOT 1.1 06/23/2024   ALKPHOS 72 11/19/2022   AST 20  06/23/2024   ALT 35 06/23/2024   PROT 7.9 06/23/2024   ALBUMIN 4.1 11/19/2022   CALCIUM 9.5 06/23/2024   GFRAA >60 03/01/2018   QFTBGOLDPLUS NEGATIVE 06/23/2024    Speciality Comments: Cosentyx  started 10/26/23  Procedures:  No procedures performed Allergies: Patient has no known allergies.   Assessment / Plan:     Visit Diagnoses: Psoriatic  arthritis (HCC) - Plan: Secukinumab  (COSENTYX  UNOREADY) 300 MG/2ML SOAJ Arthropathic psoriasis improved with Cosentyx . Residual swelling in left small finger and third finger. Low-level joint inflammation. No new rashes or infections. - Continue Cosentyx  300 mg [place] monthly. - Schedule follow-up in six months repeat monitoring if labs okay, or sooner if symptoms worsen. - Arrange prescription refills for monthly procedures.   High risk medication use - Cosentyx  300 mg subcu monthly - Plan: CBC with Differential/Platelet, Comprehensive metabolic panel with GFR, Secukinumab  (COSENTYX  UNOREADY) 300 MG/2ML SOAJ - Order blood tests for liver function and white blood cell count.      Orders: Orders Placed This Encounter  Procedures   CBC with Differential/Platelet   Comprehensive metabolic panel with GFR   Meds ordered this encounter  Medications   Secukinumab  (COSENTYX  UNOREADY) 300 MG/2ML SOAJ    Sig: Inject 300 mg into the skin every 28 (twenty-eight) days.    Dispense:  2 mL    Refill:  6     Follow-Up Instructions: Return in about 6 months (around 03/30/2025) for PsA on COS f/u 6mos.   Lonni LELON Ester, MD  Note - This record has been created using [employer].  Chart creation errors have been sought, but may not always  have been located. Such creation errors do not reflect on  the standard of medical care.

## 2024-09-29 ENCOUNTER — Ambulatory Visit: Payer: Self-pay | Attending: Internal Medicine | Admitting: Internal Medicine

## 2024-09-29 ENCOUNTER — Encounter: Payer: Self-pay | Admitting: Internal Medicine

## 2024-09-29 VITALS — BP 136/90 | Temp 97.1°F | Resp 16 | Ht 66.0 in | Wt 207.2 lb

## 2024-09-29 DIAGNOSIS — Z79899 Other long term (current) drug therapy: Secondary | ICD-10-CM

## 2024-09-29 DIAGNOSIS — L4050 Arthropathic psoriasis, unspecified: Secondary | ICD-10-CM

## 2024-09-29 MED ORDER — COSENTYX UNOREADY 300 MG/2ML ~~LOC~~ SOAJ: 300.0000 mg | SUBCUTANEOUS | 6 refills | Status: AC

## 2024-09-30 LAB — CBC WITH DIFFERENTIAL/PLATELET
Absolute Lymphocytes: 3341 {cells}/uL (ref 850–3900)
Absolute Monocytes: 808 {cells}/uL (ref 200–950)
Basophils Absolute: 51 {cells}/uL (ref 0–200)
Basophils Relative: 0.6 %
Eosinophils Absolute: 536 {cells}/uL — ABNORMAL HIGH (ref 15–500)
Eosinophils Relative: 6.3 %
HCT: 50.7 % — ABNORMAL HIGH (ref 38.5–50.0)
Hemoglobin: 16.7 g/dL (ref 13.2–17.1)
MCH: 29.4 pg (ref 27.0–33.0)
MCHC: 32.9 g/dL (ref 32.0–36.0)
MCV: 89.3 fL (ref 80.0–100.0)
MPV: 10.3 fL (ref 7.5–12.5)
Monocytes Relative: 9.5 %
Neutro Abs: 3766 {cells}/uL (ref 1500–7800)
Neutrophils Relative %: 44.3 %
Platelets: 252 Thousand/uL (ref 140–400)
RBC: 5.68 Million/uL (ref 4.20–5.80)
RDW: 13.8 % (ref 11.0–15.0)
Total Lymphocyte: 39.3 %
WBC: 8.5 Thousand/uL (ref 3.8–10.8)

## 2024-09-30 LAB — COMPREHENSIVE METABOLIC PANEL WITH GFR
AG Ratio: 1.3 (calc) (ref 1.0–2.5)
ALT: 234 U/L — ABNORMAL HIGH (ref 9–46)
AST: 128 U/L — ABNORMAL HIGH (ref 10–40)
Albumin: 4.3 g/dL (ref 3.6–5.1)
Alkaline phosphatase (APISO): 75 U/L (ref 36–130)
BUN: 19 mg/dL (ref 7–25)
CO2: 25 mmol/L (ref 20–32)
Calcium: 9.2 mg/dL (ref 8.6–10.3)
Chloride: 102 mmol/L (ref 98–110)
Creat: 0.92 mg/dL (ref 0.60–1.26)
Globulin: 3.2 g/dL (ref 1.9–3.7)
Glucose, Bld: 90 mg/dL (ref 65–99)
Potassium: 3.8 mmol/L (ref 3.5–5.3)
Sodium: 136 mmol/L (ref 135–146)
Total Bilirubin: 0.5 mg/dL (ref 0.2–1.2)
Total Protein: 7.5 g/dL (ref 6.1–8.1)
eGFR: 111 mL/min/1.73m2 (ref >=60)

## 2025-03-30 ENCOUNTER — Ambulatory Visit: Payer: Self-pay | Admitting: Internal Medicine
